# Patient Record
Sex: Male | Born: 1970 | Race: Black or African American | Hispanic: No | Smoking: Never smoker
Health system: Southern US, Community
[De-identification: ages and names within clinical notes are randomized; demographics above are authoritative.]

## PROBLEM LIST (undated history)

## (undated) DIAGNOSIS — I10 Essential (primary) hypertension: Secondary | ICD-10-CM

## (undated) DIAGNOSIS — G4700 Insomnia, unspecified: Secondary | ICD-10-CM

## (undated) DIAGNOSIS — Z9989 Dependence on other enabling machines and devices: Secondary | ICD-10-CM

## (undated) DIAGNOSIS — E785 Hyperlipidemia, unspecified: Secondary | ICD-10-CM

## (undated) DIAGNOSIS — G4733 Obstructive sleep apnea (adult) (pediatric): Secondary | ICD-10-CM

## (undated) HISTORY — DX: Hyperlipidemia, unspecified: E78.5

## (undated) HISTORY — DX: Insomnia, unspecified: G47.00

## (undated) HISTORY — DX: Obstructive sleep apnea (adult) (pediatric): G47.33

## (undated) HISTORY — DX: Essential (primary) hypertension: I10

## (undated) HISTORY — DX: Dependence on other enabling machines and devices: Z99.89

## (undated) HISTORY — PX: WISDOM TOOTH EXTRACTION: SHX21

---

## 2005-04-07 ENCOUNTER — Ambulatory Visit: Payer: Self-pay | Admitting: Family Medicine

## 2011-11-10 ENCOUNTER — Other Ambulatory Visit: Payer: Self-pay | Admitting: Family Medicine

## 2011-11-10 DIAGNOSIS — R109 Unspecified abdominal pain: Secondary | ICD-10-CM

## 2011-11-14 ENCOUNTER — Ambulatory Visit
Admission: RE | Admit: 2011-11-14 | Discharge: 2011-11-14 | Disposition: A | Discharge status: Home / Self Care | Payer: Self-pay | Source: Ambulatory Visit | Attending: Family Medicine | Admitting: Family Medicine

## 2011-11-14 DIAGNOSIS — R109 Unspecified abdominal pain: Secondary | ICD-10-CM

## 2012-05-01 ENCOUNTER — Encounter (HOSPITAL_BASED_OUTPATIENT_CLINIC_OR_DEPARTMENT_OTHER): Payer: Self-pay | Admitting: *Deleted

## 2012-05-01 ENCOUNTER — Emergency Department (HOSPITAL_BASED_OUTPATIENT_CLINIC_OR_DEPARTMENT_OTHER)
Admission: EM | Admit: 2012-05-01 | Discharge: 2012-05-01 | Disposition: A | Discharge status: Home / Self Care | Payer: Worker's Compensation | Attending: Emergency Medicine | Admitting: Emergency Medicine

## 2012-05-01 DIAGNOSIS — IMO0002 Reserved for concepts with insufficient information to code with codable children: Secondary | ICD-10-CM | POA: Insufficient documentation

## 2012-05-01 DIAGNOSIS — S56919A Strain of unspecified muscles, fascia and tendons at forearm level, unspecified arm, initial encounter: Secondary | ICD-10-CM

## 2012-05-01 DIAGNOSIS — Y929 Unspecified place or not applicable: Secondary | ICD-10-CM | POA: Insufficient documentation

## 2012-05-01 DIAGNOSIS — Y9389 Activity, other specified: Secondary | ICD-10-CM | POA: Insufficient documentation

## 2012-05-01 DIAGNOSIS — X58XXXA Exposure to other specified factors, initial encounter: Secondary | ICD-10-CM | POA: Insufficient documentation

## 2012-05-01 NOTE — ED Provider Notes (Signed)
History     CSN: [id number]  Arrival date & time 05/01/12  2151   First MD Initiated Contact with Patient 05/01/12 2212      Chief Complaint  Patient presents with  . Arm Pain    (Consider location/radiation/quality/duration/timing/severity/associated sxs/prior treatment) HPI Pt reports onset of mild-moderate aching pain in R forearm after opening a door earlier today, pain has been persistent through the day worse with movement of wrist or lifting. No falls or direct trauma.   No past medical history on file.  History reviewed. No pertinent past surgical history.  No family history on file.  History  Substance Use Topics  . Smoking status: Never Smoker   . Smokeless tobacco: Not on file  . Alcohol Use: No      Review of Systems All other systems reviewed and are negative except as noted in HPI.   Allergies  Review of patient's allergies indicates no known allergies.  Home Medications  No current outpatient prescriptions on file.  BP 160/97  Pulse 87  Temp 98.5 F (36.9 C) (Oral)  Resp 20  SpO2 99%  Physical Exam  Nursing note and vitals reviewed. Constitutional: He is oriented to person, place, and time. He appears well-developed and well-nourished.  HENT:  Head: Normocephalic and atraumatic.  Eyes: EOM are normal. Pupils are equal, round, and reactive to light.  Neck: Normal range of motion. Neck supple.  Cardiovascular: Normal rate, normal heart sounds and intact distal pulses.   Pulmonary/Chest: Effort normal and breath sounds normal.  Abdominal: Bowel sounds are normal. He exhibits no distension. There is no tenderness.  Musculoskeletal: Normal range of motion. He exhibits tenderness (mild tenderness to R forearm). He exhibits no edema.       No swelling, no deformity, normal muscle/tendon function  Neurological: He is alert and oriented to person, place, and time. He has normal strength. No cranial nerve deficit or sensory deficit.  Skin: Skin is  warm and dry. No rash noted.  Psychiatric: He has a normal mood and affect.    ED Course  Procedures (including critical care time)  Labs Reviewed - No data to display No results found.   No diagnosis found.    MDM  Likely forearm muscle strain, no concern for bony injury. Advised RICE therapy. Pt is off work for the next 4 days.         Charles B. Bernette Mayers, MD 05/01/12 2225

## 2012-05-01 NOTE — ED Notes (Signed)
Was opening a door and had a sudden onset of sharp pain in his right forearm.

## 2013-07-22 ENCOUNTER — Ambulatory Visit: Payer: 59 | Admitting: Cardiology

## 2013-12-17 ENCOUNTER — Ambulatory Visit
Admission: RE | Admit: 2013-12-17 | Discharge: 2013-12-17 | Disposition: A | Discharge status: Home / Self Care | Payer: 59 | Source: Ambulatory Visit | Attending: Family Medicine | Admitting: Family Medicine

## 2013-12-17 ENCOUNTER — Other Ambulatory Visit: Payer: Self-pay | Admitting: Family Medicine

## 2013-12-17 DIAGNOSIS — M25562 Pain in left knee: Secondary | ICD-10-CM

## 2013-12-26 ENCOUNTER — Ambulatory Visit
Admission: RE | Admit: 2013-12-26 | Discharge: 2013-12-26 | Disposition: A | Discharge status: Home / Self Care | Payer: 59 | Source: Ambulatory Visit | Attending: Family Medicine | Admitting: Family Medicine

## 2013-12-26 ENCOUNTER — Other Ambulatory Visit: Payer: Self-pay | Admitting: Family Medicine

## 2013-12-26 DIAGNOSIS — M549 Dorsalgia, unspecified: Secondary | ICD-10-CM

## 2015-06-24 ENCOUNTER — Other Ambulatory Visit: Payer: Self-pay | Admitting: Family Medicine

## 2015-06-24 ENCOUNTER — Ambulatory Visit
Admission: RE | Admit: 2015-06-24 | Discharge: 2015-06-24 | Disposition: A | Discharge status: Home / Self Care | Payer: Commercial Managed Care - HMO | Source: Ambulatory Visit | Attending: Family Medicine | Admitting: Family Medicine

## 2015-06-24 DIAGNOSIS — J690 Pneumonitis due to inhalation of food and vomit: Secondary | ICD-10-CM

## 2016-08-17 DIAGNOSIS — J019 Acute sinusitis, unspecified: Secondary | ICD-10-CM | POA: Diagnosis not present

## 2016-08-17 DIAGNOSIS — I10 Essential (primary) hypertension: Secondary | ICD-10-CM | POA: Diagnosis not present

## 2016-09-20 DIAGNOSIS — I10 Essential (primary) hypertension: Secondary | ICD-10-CM | POA: Diagnosis not present

## 2016-09-20 DIAGNOSIS — J309 Allergic rhinitis, unspecified: Secondary | ICD-10-CM | POA: Diagnosis not present

## 2016-10-24 DIAGNOSIS — K219 Gastro-esophageal reflux disease without esophagitis: Secondary | ICD-10-CM | POA: Diagnosis not present

## 2017-03-09 DIAGNOSIS — E559 Vitamin D deficiency, unspecified: Secondary | ICD-10-CM | POA: Diagnosis not present

## 2017-03-09 DIAGNOSIS — Z23 Encounter for immunization: Secondary | ICD-10-CM | POA: Diagnosis not present

## 2017-03-09 DIAGNOSIS — R5383 Other fatigue: Secondary | ICD-10-CM | POA: Diagnosis not present

## 2017-03-09 DIAGNOSIS — Z0000 Encounter for general adult medical examination without abnormal findings: Secondary | ICD-10-CM | POA: Diagnosis not present

## 2017-03-09 DIAGNOSIS — Z79899 Other long term (current) drug therapy: Secondary | ICD-10-CM | POA: Diagnosis not present

## 2017-03-09 DIAGNOSIS — I10 Essential (primary) hypertension: Secondary | ICD-10-CM | POA: Diagnosis not present

## 2017-03-09 DIAGNOSIS — E291 Testicular hypofunction: Secondary | ICD-10-CM | POA: Diagnosis not present

## 2017-03-15 ENCOUNTER — Other Ambulatory Visit: Payer: Self-pay | Admitting: Family Medicine

## 2017-03-15 DIAGNOSIS — R5383 Other fatigue: Secondary | ICD-10-CM

## 2017-03-20 ENCOUNTER — Ambulatory Visit (HOSPITAL_COMMUNITY): Payer: 59 | Attending: Cardiology

## 2017-03-20 ENCOUNTER — Other Ambulatory Visit: Payer: Self-pay

## 2017-03-20 DIAGNOSIS — R5383 Other fatigue: Secondary | ICD-10-CM | POA: Diagnosis not present

## 2017-03-31 ENCOUNTER — Encounter: Payer: Self-pay | Admitting: Cardiology

## 2017-03-31 ENCOUNTER — Ambulatory Visit (INDEPENDENT_AMBULATORY_CARE_PROVIDER_SITE_OTHER): Payer: 59 | Admitting: Cardiology

## 2017-03-31 VITALS — BP 142/82 | HR 60 | Ht 71.0 in | Wt 260.0 lb

## 2017-03-31 DIAGNOSIS — R0609 Other forms of dyspnea: Secondary | ICD-10-CM | POA: Insufficient documentation

## 2017-03-31 DIAGNOSIS — R5383 Other fatigue: Secondary | ICD-10-CM | POA: Diagnosis not present

## 2017-03-31 DIAGNOSIS — I10 Essential (primary) hypertension: Secondary | ICD-10-CM | POA: Diagnosis not present

## 2017-03-31 DIAGNOSIS — R0600 Dyspnea, unspecified: Secondary | ICD-10-CM | POA: Insufficient documentation

## 2017-03-31 DIAGNOSIS — R9431 Abnormal electrocardiogram [ECG] [EKG]: Secondary | ICD-10-CM | POA: Insufficient documentation

## 2017-03-31 NOTE — Assessment & Plan Note (Signed)
Borderline control on Tribenzor.  No signs of hypertensive heart disease on echo With his fatigue, I would be reluctant to use a beta-blocker.   Could potentially titrate up the amlodipine portion of Tribenzor.

## 2017-03-31 NOTE — Patient Instructions (Addendum)
NO CHANGE WITH CURRENT MEDICATION  SCHEDULE AT [facility] Your physician has recommended that you have a cardiopulmonary stress test (CPX). CPX testing is a non-invasive measurement of heart and lung function. It replaces a traditional treadmill stress test. This type of test provides a tremendous amount of information that relates not only to your present condition but also for future outcomes. This test combines measurements of you ventilation, respiratory gas exchange in the lungs, electrocardiogram (EKG), blood pressure and physical response before, during, and following an exercise protocol.    Your physician recommends that you schedule a follow-up appointment in 1 MONTH WITH DR HARDING.  Cardiopulmonary Exercise Stress Test Cardiopulmonary exercise testing (CPET) is a test that checks how your heart and lungs react to exercise. This is called your exercise capacity. During this test, you will walk or run on a treadmill or pedal on a stationary bike while tests are done on your heart and lungs. You may have this test to:  See why you are short of breath.  Check for exercise intolerance.  See how your lungs work.  See how your heart works.  Check for how you are responding to a heart or lung rehabilitation program.  See if you have a heart or lung problem.  See if you are healthy enough to have surgery.  What happens before the procedure?  Follow instructions from your doctor about what you cannot eat or drink.  Ask your doctor about changing or stopping your normal medicines. This is important if you take diabetes medicines or blood thinners.  Wear loose, comfortable clothing and shoes.  If you use an inhaler, bring it with you to the test. What happens during the procedure?  A blood pressure cuff will be placed on your arm.  Several stick-on patches (electrodes) will be placed on your chest. They will be attached to an electrocardiogram (EKG) machine.  A clip-on  monitor that measures the amount of oxygen in your blood will be placed on your finger (pulse oximeter).  A clip will be placed on your nose and a mouthpiece will be placed in your mouth. This may be held in place with a headpiece. You will breathe through the mouthpiece during the test.  You will be asked to start exercising. You will be closely watched while you exercise.  The amount of effort for your exercise will be gradually increased.  During exercise, the test will measure: ? Your heart rate. ? Your heart rhythm. ? Your oxygen blood level. ? The amount of oxygen and carbon dioxide that you breathe out.  The test will end when: ? You have finished the test. ? You have reached your maximum ability to exercise. ? You have chest or leg pain, dizziness, or shortness of breath. The procedure may vary among doctors and hospitals. What happens after the procedure?  Your blood pressure and EKG will be checked to watch how you recover from the test. This information is not intended to replace advice given to you by your health care provider. Make sure you discuss any questions you have with your health care provider. Document Released: 04/27/2009 Document Revised: 09/29/2015 Document Reviewed: 03/23/2015 Elsevier Interactive Patient Education  2018 [employer]

## 2017-03-31 NOTE — Assessment & Plan Note (Signed)
Hard to tell what this is related to.  Could simply be related to poor sleep or allergies.  He is being treated for sleep apnea, and one would expect it to be effective.  To evaluate for a potential cardiac etiology we will do a CPX

## 2017-03-31 NOTE — Assessment & Plan Note (Signed)
EKG shows sinus rhythm really prominent T wave inversions in inferior and lateral look more consistent with LVH repolarization abnormalities.  Cannot exclude ischemia, therefore we will evaluate with CPX for ischemia.

## 2017-03-31 NOTE — Assessment & Plan Note (Signed)
Difficult to tell if this is cardiac or simply due to deconditioning potentially related to his CPAP.  As he is not truly having anginal type symptoms, I would prefer to evaluate physiologically. Echocardiogram looked normal with normal ejection fraction and diastolic function.  Plan: CPX (cardiopulmonary exercise test)

## 2017-03-31 NOTE — Progress Notes (Signed)
PCP: Mila Palmer, MD  Clinic Note: Chief Complaint  Patient presents with  . New Patient (Initial Visit)    Fatigue, shortness of breath    HPI:  Leroy Cochran is a 46 y.o. male who is being seen today for the evaluation of Fatigue & DOE at the request of Mila Palmer, MD.  Leroy Cochran was last seen on March 09, 2017 by Dr. Paulino Rily --> he was there for routine follow-up, was noticing some fatigue and some dyspnea.  A 2D echocardiogram was ordered.  Recent Hospitalizations: None  Studies Personally Reviewed - (if available, images/films reviewed: From Epic Chart or Care Everywhere)  2DEcho 03/20/2017: Normal LV systolic and diastolic function; trace MR and TR.  EF 60-65%  Interval History: Leroy Cochran presents for essentially a follow-up after his echocardiogram indicating that he just has had pretty significant sensation of feeling just tired all the time.  He just does not have a lot of energy.  Not much get up and go.  He notes that anytime he does any exercise he is just feels tired and short of breath.  He has had intermittent episodes of slight twinging chest discomfort here and there but nothing sustained or necessarily associated with exertion. He has severe OSA and does use CPAP.  If he tries to sleep without CPAP, he cannot breathe.  He has been using CPAP for about 3 or 4 years.  He also has been told that he had low vitamin D level as well as low testosterone level.  Remainder of cardiac review of symptoms: No PND, orthopnea or edema.  No palpitations, lightheadedness, dizziness, weakness or syncope/near syncope. No TIA/amaurosis fugax symptoms. No claudication.  ROS: A comprehensive was performed.  Pertinent symptoms noted in HPI Review of Systems  Constitutional: Positive for malaise/fatigue.  HENT: Positive for congestion (Constantly feels congested). Negative for nosebleeds.   Respiratory: Positive for cough (Mostly in the morning) and shortness of breath  (If he does not use CPAP). Negative for sputum production.   Gastrointestinal: Negative for blood in stool and melena.  Genitourinary: Negative for hematuria.  Musculoskeletal: Negative for joint pain.  Neurological: Negative for dizziness.  Endo/Heme/Allergies: Positive for environmental allergies.  Psychiatric/Behavioral: Negative for depression and memory loss. The patient has insomnia.   All other systems reviewed and are negative.  I have reviewed and (if needed) personally updated the patient's problem list, medications, allergies, past medical and surgical history, social and family history.   Past Medical History:  Diagnosis Date  . Essential hypertension   . Hyperlipidemia   . Insomnia   . OSA on CPAP    Dr.Turner;; CPAP -1, (RDI=82.7, Low O2 - 78%)    Past Surgical History:  Procedure Laterality Date  . WISDOM TOOTH EXTRACTION      No outpatient medications have been marked as taking for the 03/31/17 encounter (Office Visit) with Marykay Lex, MD.    No Known Allergies  Social History   Socioeconomic History  . Marital status: Married    Spouse name: None  . Number of children: 1  . Years of education: None  . Highest education level: High school graduate  Social Needs  . Financial resource strain: None  . Food insecurity - worry: None  . Food insecurity - inability: None  . Transportation needs - medical: None  . Transportation needs - non-medical: None  Occupational History  . None  Tobacco Use  . Smoking status: Never Smoker  . Smokeless tobacco: Never  Used  Substance and Sexual Activity  . Alcohol use: Yes    Comment: occasional wine  . Drug use: No  . Sexual activity: None  Other Topics Concern  . None  Social History Narrative   He is a divorced father of 1 daughter.  He lives with his daughter.    family history includes Cervical cancer in his mother; Healthy in his daughter, father, and sister; Heart attack in his maternal  grandfather.  Wt Readings from Last 3 Encounters:  03/31/17 260 lb (117.9 kg)    PHYSICAL EXAM BP (!) 142/82   Pulse 60   Ht 5\' 11"  (1.803 m)   Wt 260 lb (117.9 kg)   SpO2 99%   BMI 36.26 kg/m  Physical Exam  Constitutional: He is oriented to person, place, and time. He appears well-developed and well-nourished. No distress.  Healthy-appearing  HENT:  Head: Normocephalic and atraumatic.  Mouth/Throat: No oropharyngeal exudate.  Eyes: Conjunctivae and EOM are normal. Pupils are equal, round, and reactive to light. No scleral icterus.  Neck: Normal range of motion. Neck supple. No hepatojugular reflux and no JVD present. Carotid bruit is not present. No thyromegaly present.  Cardiovascular: Normal rate, regular rhythm, normal heart sounds, intact distal pulses and normal pulses.  No extrasystoles are present. PMI is not displaced. Exam reveals no gallop, no friction rub and no midsystolic click.  No murmur heard. Pulmonary/Chest: Effort normal and breath sounds normal. No respiratory distress. He has no wheezes. He has no rales.  Abdominal: Soft. Bowel sounds are normal. He exhibits no distension. There is no tenderness. There is no rebound and no guarding.  Musculoskeletal: Normal range of motion. He exhibits no edema or deformity.  Neurological: He is alert and oriented to person, place, and time. No cranial nerve deficit.  Skin: Skin is warm and dry. No rash noted. No erythema. No pallor.  Psychiatric: He has a normal mood and affect. Judgment and thought content normal.  Nursing note and vitals reviewed.    Adult ECG Report  Rate: 60;  Rhythm: normal sinus rhythm and Cannot rule out inferior MI, age undetermined.  Criteria consistent with LVH.  T wave inversions in lateral leads, cannot exclude ischemia.  (Likely LVH repolarization);   Narrative Interpretation: Borderline EKG   Other studies Reviewed: Additional studies/ records that were reviewed today include:  Recent  Labs: Labs not currently available  ASSESSMENT / PLAN: Problem List Items Addressed This Visit    Abnormal electrocardiogram (ECG) (EKG)    EKG shows sinus rhythm really prominent T wave inversions in inferior and lateral look more consistent with LVH repolarization abnormalities.  Cannot exclude ischemia, therefore we will evaluate with CPX for ischemia.      Relevant Orders   EKG 12-Lead   Cardiopulmonary exercise test   Dyspnea on exertion - Primary    Difficult to tell if this is cardiac or simply due to deconditioning potentially related to his CPAP.  As he is not truly having anginal type symptoms, I would prefer to evaluate physiologically. Echocardiogram looked normal with normal ejection fraction and diastolic function.  Plan: CPX (cardiopulmonary exercise test)      Relevant Orders   EKG 12-Lead   Cardiopulmonary exercise test   Essential hypertension (Chronic)    Borderline control on Tribenzor.  No signs of hypertensive heart disease on echo With his fatigue, I would be reluctant to use a beta-blocker.   Could potentially titrate up the amlodipine portion of Tribenzor.  Relevant Medications   TRIBENZOR 40-5-12.5 MG TABS   Fatigue    Hard to tell what this is related to.  Could simply be related to poor sleep or allergies.  He is being treated for sleep apnea, and one would expect it to be effective.  To evaluate for a potential cardiac etiology we will do a CPX      Relevant Orders   EKG 12-Lead   Cardiopulmonary exercise test      Current medicines are reviewed at length with the patient today. (+/- concerns) none The following changes have been made:None  Patient Instructions  NO CHANGE WITH CURRENT MEDICATION  [facility]ULE AT [facility] Your physician has recommended that you have a cardiopulmonary stress test (CPX). CPX testing is a non-invasive measurement of heart and lung function. It replaces a traditional treadmill stress test. This type of  test provides a tremendous amount of information that relates not only to your present condition but also for future outcomes. This test combines measurements of you ventilation, respiratory gas exchange in the lungs, electrocardiogram (EKG), blood pressure and physical response before, during, and following an exercise protocol.    Your physician recommends that you schedule a follow-up appointment in 1 MONTH WITH DR HARDING.  Cardiopulmonary Exercise Stress Test Cardiopulmonary exercise testing (CPET) is a test that checks how your heart and lungs react to exercise. This is called your exercise capacity. During this test, you will walk or run on a treadmill or pedal on a stationary bike while tests are done on your heart and lungs. You may have this test to:  See why you are short of breath.  Check for exercise intolerance.  See how your lungs work.  See how your heart works.  Check for how you are responding to a heart or lung rehabilitation program.  See if you have a heart or lung problem.  See if you are healthy enough to have surgery.  What happens before the procedure?  Follow instructions from your doctor about what you cannot eat or drink.  Ask your doctor about changing or stopping your normal medicines. This is important if you take diabetes medicines or blood thinners.  Wear loose, comfortable clothing and shoes.  If you use an inhaler, bring it with you to the test. What happens during the procedure?  A blood pressure cuff will be placed on your arm.  Several stick-on patches (electrodes) will be placed on your chest. They will be attached to an electrocardiogram (EKG) machine.  A clip-on monitor that measures the amount of oxygen in your blood will be placed on your finger (pulse oximeter).  A clip will be placed on your nose and a mouthpiece will be placed in your mouth. This may be held in place with a headpiece. You will breathe through the mouthpiece during  the test.  You will be asked to start exercising. You will be closely watched while you exercise.  The amount of effort for your exercise will be gradually increased.  During exercise, the test will measure: ? Your heart rate. ? Your heart rhythm. ? Your oxygen blood level. ? The amount of oxygen and carbon dioxide that you breathe out.  The test will end when: ? You have finished the test. ? You have reached your maximum ability to exercise. ? You have chest or leg pain, dizziness, or shortness of breath. The procedure may vary among doctors and hospitals. What happens after the procedure?  Your blood pressure and EKG  will be checked to watch how you recover from the test. This information is not intended to replace advice given to you by your health care provider. Make sure you discuss any questions you have with your health care provider. Document Released: 04/27/2009 Document Revised: 09/29/2015 Document Reviewed: 03/23/2015 Elsevier Interactive Patient Education  2018 [employer].    Studies Ordered:   Orders Placed This Encounter  Procedures  . Cardiopulmonary exercise test  . EKG 12-Lead      Bryan Lemma, M.D., M.S. Interventional Cardiologist   Pager # [phone] Phone # [phone] [address] [place], [place] [address]

## 2017-04-02 ENCOUNTER — Encounter: Payer: Self-pay | Admitting: Cardiology

## 2017-04-03 DIAGNOSIS — R0981 Nasal congestion: Secondary | ICD-10-CM | POA: Diagnosis not present

## 2017-04-03 DIAGNOSIS — J342 Deviated nasal septum: Secondary | ICD-10-CM | POA: Diagnosis not present

## 2017-04-03 DIAGNOSIS — J343 Hypertrophy of nasal turbinates: Secondary | ICD-10-CM | POA: Diagnosis not present

## 2017-04-17 ENCOUNTER — Other Ambulatory Visit (HOSPITAL_COMMUNITY): Payer: Self-pay | Admitting: *Deleted

## 2017-04-17 ENCOUNTER — Ambulatory Visit (HOSPITAL_COMMUNITY): Payer: 59 | Attending: Cardiology

## 2017-04-17 DIAGNOSIS — R5383 Other fatigue: Secondary | ICD-10-CM

## 2017-04-17 DIAGNOSIS — R0600 Dyspnea, unspecified: Secondary | ICD-10-CM | POA: Diagnosis not present

## 2017-04-17 DIAGNOSIS — R9431 Abnormal electrocardiogram [ECG] [EKG]: Secondary | ICD-10-CM | POA: Diagnosis present

## 2017-04-17 DIAGNOSIS — R0609 Other forms of dyspnea: Secondary | ICD-10-CM | POA: Diagnosis not present

## 2017-04-17 HISTORY — PX: OTHER SURGICAL HISTORY: SHX169

## 2017-04-20 DIAGNOSIS — E291 Testicular hypofunction: Secondary | ICD-10-CM | POA: Diagnosis not present

## 2017-04-20 DIAGNOSIS — Z79899 Other long term (current) drug therapy: Secondary | ICD-10-CM | POA: Diagnosis not present

## 2017-05-04 ENCOUNTER — Encounter: Payer: Self-pay | Admitting: Cardiology

## 2017-05-04 ENCOUNTER — Ambulatory Visit: Payer: 59 | Admitting: Cardiology

## 2017-05-04 VITALS — BP 136/92 | HR 80 | Ht 71.0 in | Wt 261.6 lb

## 2017-05-04 DIAGNOSIS — R9431 Abnormal electrocardiogram [ECG] [EKG]: Secondary | ICD-10-CM | POA: Diagnosis not present

## 2017-05-04 DIAGNOSIS — R0600 Dyspnea, unspecified: Secondary | ICD-10-CM

## 2017-05-04 DIAGNOSIS — R0609 Other forms of dyspnea: Secondary | ICD-10-CM | POA: Diagnosis not present

## 2017-05-04 DIAGNOSIS — R5383 Other fatigue: Secondary | ICD-10-CM

## 2017-05-04 DIAGNOSIS — I10 Essential (primary) hypertension: Secondary | ICD-10-CM

## 2017-05-04 NOTE — Assessment & Plan Note (Signed)
Based on CPX results, I would suggest that his symptoms are probably related to deconditioning and obesity.  Recommend increasing exercise level and weight loss.

## 2017-05-04 NOTE — Assessment & Plan Note (Signed)
Borderline elevated diastolic pressure today, but for his stress test, his diastolic pressure was relatively normal and appropriately went down despite having a systolic hypertension response. -Closely monitor, and consider possibility of additional medication which would probably be best suited to be a beta-blocker such as carvedilol or Bystolic.

## 2017-05-04 NOTE — Assessment & Plan Note (Signed)
More consistent with repolarization changes.  Nonischemic CPX.  Relatively normal echocardiogram.

## 2017-05-04 NOTE — Assessment & Plan Note (Signed)
Normal echocardiogram and essentially normal CPX.  He does have some mild hypertension with exercise, but has baseline normal blood pressures.  I would be reluctant to add additional medications at this point to the Tribenzor.  Would potentially consider carvedilol or Bystolic. CPX suggests that most of his symptoms are related to deconditioning and obesity. No real suggestion of coronary ischemia.  EKG had some subtle changes, but not diagnostic.

## 2017-05-04 NOTE — Progress Notes (Signed)
PCP: Mila PalmerWolters, Sharon, MD  Clinic Note: Chief Complaint  Patient presents with  . Follow-up    CPX results -evaluate fatigue  . Shortness of Breath    And fatigue with exertion    HPI:  Leroy Cochran is a 46 y.o. male who is being seen today for follow-up evaluation of Fatigue & DOE at the request of Mila PalmerWolters, Sharon, MD.  Leroy KeensMarcus A Cochran was initially seen on March 31, 2017 patient following up an echocardiogram.  He noted fatigue with lack of get up and go.  May be some mild exertional dyspnea. He has severe OSA but does not use CPAP. We ordered a CPX.  Recent Hospitalizations: None  Studies Personally Revi/ewed - (if available, images/films reviewed: From Epic Chart or Care Everywhere)  2DEcho 10/292018: Normal LV systolic and diastolic function; trace MR and TR.  EF 60-65%  CPX  04/17/2017: Limited due to submaximal exercise (did not reach target heart rate).  Response.  Some shortness of breath related to abnormal relaxation.  No cardiopulmonary limitations.  Limitations more related to deconditioning and body habitus.  Interval History: Leroy SpareMarcus presents for essentially a follow-up after his CPX.  He is doing pretty well.  He still notes feeling tired, but denies any significant exertional dyspnea.  Just lack of energy.  He is still using his CPAP. He denies any chest tightness or pressure with rest or exertion.  He will get short of breath if he overexerts, but not with routine activity.  Remainder of cardiac review of symptoms: No PND, orthopnea or edema.  No palpitations, lightheadedness, dizziness, weakness or syncope/near syncope. No TIA/amaurosis fugax symptoms. No claudication.   ROS: A comprehensive was performed.  Pertinent symptoms noted in HPI Review of Systems  Constitutional: Positive for malaise/fatigue. Negative for weight loss.  HENT: Positive for congestion (Constantly feels congested).   Respiratory: Negative for cough, sputum production and  shortness of breath (Only if he does not use CPAP).   Neurological: Negative for dizziness.  Psychiatric/Behavioral: Negative for depression and memory loss. The patient has insomnia.    I have reviewed and (if needed) personally updated the patient's problem list, medications, allergies, past medical and surgical history, social and family history.   Past Medical History:  Diagnosis Date  . Essential hypertension   . Hyperlipidemia   . Insomnia   . OSA on CPAP    Dr.Turner;; CPAP -1, (RDI=82.7, Low O2 - 78%)    Past Surgical History:  Procedure Laterality Date  . CPX - CARDIOPULMONARY EXERCISE TEST  04/17/2017    Limited due to submaximal exercise (did not reach target heart rate).  Response.  Some shortness of breath related to abnormal relaxation.  No cardiopulmonary limitations.  Limitations more related to deconditioning and body habitus.  . WISDOM TOOTH EXTRACTION      Current Meds  Medication Sig  . cetirizine (ZYRTEC) 10 MG tablet Take 10 mg by mouth. As needed  . TRIBENZOR 40-5-12.5 MG TABS Take 1 tablet daily by mouth.    No Known Allergies  Social History   Tobacco Use  . Smoking status: Never Smoker  . Smokeless tobacco: Never Used  Substance Use Topics  . Alcohol use: Yes    Comment: occasional wine  . Drug use: No   Social History   Social History Narrative   He is a divorced father of 1 daughter.  He lives with his daughter.     family history includes Cervical cancer in his mother; Healthy in  his daughter, father, and sister; Heart attack in his maternal grandfather.  Wt Readings from Last 3 Encounters:  05/04/17 261 lb 9.6 oz (118.7 kg)  03/31/17 260 lb (117.9 kg)    PHYSICAL EXAM BP (!) 136/92 (BP Location: Left Arm, Patient Position: Sitting, Cuff Size: Large)   Pulse 80   Ht 5\' 11"  (1.803 m)   Wt 261 lb 9.6 oz (118.7 kg)   BMI 36.49 kg/m  Physical Exam  Constitutional: He is oriented to person, place, and time. He appears  well-developed and well-nourished. No distress.  Healthy-appearing  HENT:  Head: Normocephalic and atraumatic.  Eyes: EOM are normal.  Neck: Normal range of motion. Neck supple. No hepatojugular reflux and no JVD present. Carotid bruit is not present.  Cardiovascular: Normal rate, regular rhythm, normal heart sounds, intact distal pulses and normal pulses.  No extrasystoles are present. PMI is not displaced. Exam reveals no gallop, no friction rub and no midsystolic click.  No murmur heard. Pulmonary/Chest: Effort normal and breath sounds normal. No respiratory distress. He has no wheezes. He has no rales.  Musculoskeletal: Normal range of motion. He exhibits no edema.  Neurological: He is alert and oriented to person, place, and time.  Psychiatric: He has a normal mood and affect. His behavior is normal. Judgment and thought content normal.  Nursing note and vitals reviewed.    Adult ECG Report n/a   Other studies Reviewed: Additional studies/ records that were reviewed today include:  Recent Labs: Labs not currently available  ASSESSMENT / PLAN: Problem List Items Addressed This Visit    Abnormal electrocardiogram (ECG) (EKG)    More consistent with repolarization changes.  Nonischemic CPX.  Relatively normal echocardiogram.      Dyspnea on exertion - Primary    Normal echocardiogram and essentially normal CPX.  He does have some mild hypertension with exercise, but has baseline normal blood pressures.  I would be reluctant to add additional medications at this point to the Tribenzor.  Would potentially consider carvedilol or Bystolic. CPX suggests that most of his symptoms are related to deconditioning and obesity. No real suggestion of coronary ischemia.  EKG had some subtle changes, but not diagnostic.      Essential hypertension (Chronic)    Borderline elevated diastolic pressure today, but for his stress test, his diastolic pressure was relatively normal and appropriately  went down despite having a systolic hypertension response. -Closely monitor, and consider possibility of additional medication which would probably be best suited to be a beta-blocker such as carvedilol or Bystolic.      Fatigue    Based on CPX results, I would suggest that his symptoms are probably related to deconditioning and obesity.  Recommend increasing exercise level and weight loss.         Current medicines are reviewed at length with the patient today. (+/- concerns) none The following changes have been made:None  Patient Instructions  Medication Instructions: Your physician recommends that you continue on your current medications as directed. Please refer to the Current Medication list given to you today.   Follow-Up: Your physician recommends that you schedule a follow-up appointment as needed with Dr. Herbie BaltimoreHarding.    Studies Ordered:   No orders of the defined types were placed in this encounter.     Bryan Lemmaavid Shayonna Ocampo, M.D., M.S. Interventional Cardiologist   Pager # [phone] Phone # [phone] [address] [place], [place] [address]

## 2017-05-04 NOTE — Patient Instructions (Signed)
Medication Instructions: Your physician recommends that you continue on your current medications as directed. Please refer to the Current Medication list given to you today.   Follow-Up: Your physician recommends that you schedule a follow-up appointment as needed with Dr. Harding.    

## 2017-05-26 DIAGNOSIS — E291 Testicular hypofunction: Secondary | ICD-10-CM | POA: Diagnosis not present

## 2017-05-26 DIAGNOSIS — E559 Vitamin D deficiency, unspecified: Secondary | ICD-10-CM | POA: Diagnosis not present

## 2017-07-03 DIAGNOSIS — J301 Allergic rhinitis due to pollen: Secondary | ICD-10-CM | POA: Diagnosis not present

## 2017-07-03 DIAGNOSIS — G4733 Obstructive sleep apnea (adult) (pediatric): Secondary | ICD-10-CM | POA: Diagnosis not present

## 2017-08-28 DIAGNOSIS — E291 Testicular hypofunction: Secondary | ICD-10-CM | POA: Diagnosis not present

## 2017-10-02 ENCOUNTER — Other Ambulatory Visit: Payer: Self-pay | Admitting: Nurse Practitioner

## 2017-10-02 ENCOUNTER — Ambulatory Visit
Admission: RE | Admit: 2017-10-02 | Discharge: 2017-10-02 | Disposition: A | Discharge status: Home / Self Care | Payer: Worker's Compensation | Source: Ambulatory Visit | Attending: Nurse Practitioner | Admitting: Nurse Practitioner

## 2017-10-02 DIAGNOSIS — M25571 Pain in right ankle and joints of right foot: Secondary | ICD-10-CM

## 2017-10-02 DIAGNOSIS — M25471 Effusion, right ankle: Secondary | ICD-10-CM

## 2017-11-02 DIAGNOSIS — Z0100 Encounter for examination of eyes and vision without abnormal findings: Secondary | ICD-10-CM | POA: Diagnosis not present

## 2017-11-28 DIAGNOSIS — E291 Testicular hypofunction: Secondary | ICD-10-CM | POA: Diagnosis not present

## 2017-12-19 DIAGNOSIS — R0789 Other chest pain: Secondary | ICD-10-CM | POA: Diagnosis not present

## 2017-12-19 DIAGNOSIS — I10 Essential (primary) hypertension: Secondary | ICD-10-CM | POA: Diagnosis not present

## 2018-01-01 DIAGNOSIS — G4733 Obstructive sleep apnea (adult) (pediatric): Secondary | ICD-10-CM | POA: Diagnosis not present

## 2018-01-31 DIAGNOSIS — G4733 Obstructive sleep apnea (adult) (pediatric): Secondary | ICD-10-CM | POA: Diagnosis not present

## 2018-02-01 DIAGNOSIS — G4733 Obstructive sleep apnea (adult) (pediatric): Secondary | ICD-10-CM | POA: Diagnosis not present

## 2018-02-05 DIAGNOSIS — I10 Essential (primary) hypertension: Secondary | ICD-10-CM | POA: Diagnosis not present

## 2018-02-05 DIAGNOSIS — E291 Testicular hypofunction: Secondary | ICD-10-CM | POA: Diagnosis not present

## 2018-02-05 DIAGNOSIS — G4733 Obstructive sleep apnea (adult) (pediatric): Secondary | ICD-10-CM | POA: Diagnosis not present

## 2018-03-03 DIAGNOSIS — G4733 Obstructive sleep apnea (adult) (pediatric): Secondary | ICD-10-CM | POA: Diagnosis not present

## 2018-03-09 DIAGNOSIS — Z23 Encounter for immunization: Secondary | ICD-10-CM | POA: Diagnosis not present

## 2018-04-03 DIAGNOSIS — G4733 Obstructive sleep apnea (adult) (pediatric): Secondary | ICD-10-CM | POA: Diagnosis not present

## 2018-04-26 DIAGNOSIS — Z13220 Encounter for screening for lipoid disorders: Secondary | ICD-10-CM | POA: Diagnosis not present

## 2018-05-03 DIAGNOSIS — G4733 Obstructive sleep apnea (adult) (pediatric): Secondary | ICD-10-CM | POA: Diagnosis not present

## 2018-06-03 DIAGNOSIS — G4733 Obstructive sleep apnea (adult) (pediatric): Secondary | ICD-10-CM | POA: Diagnosis not present

## 2018-07-04 DIAGNOSIS — G4733 Obstructive sleep apnea (adult) (pediatric): Secondary | ICD-10-CM | POA: Diagnosis not present

## 2018-07-23 DIAGNOSIS — G4733 Obstructive sleep apnea (adult) (pediatric): Secondary | ICD-10-CM | POA: Diagnosis not present

## 2018-08-02 DIAGNOSIS — G4733 Obstructive sleep apnea (adult) (pediatric): Secondary | ICD-10-CM | POA: Diagnosis not present

## 2018-09-02 DIAGNOSIS — G4733 Obstructive sleep apnea (adult) (pediatric): Secondary | ICD-10-CM | POA: Diagnosis not present

## 2018-10-02 DIAGNOSIS — G4733 Obstructive sleep apnea (adult) (pediatric): Secondary | ICD-10-CM | POA: Diagnosis not present

## 2019-01-17 ENCOUNTER — Other Ambulatory Visit: Payer: Self-pay

## 2019-01-17 DIAGNOSIS — Z20822 Contact with and (suspected) exposure to covid-19: Secondary | ICD-10-CM

## 2019-01-19 LAB — NOVEL CORONAVIRUS, NAA: SARS-CoV-2, NAA: NOT DETECTED

## 2019-11-06 ENCOUNTER — Encounter: Payer: 59 | Attending: Family Medicine | Admitting: Dietician

## 2019-11-06 ENCOUNTER — Other Ambulatory Visit: Payer: Self-pay

## 2019-11-06 DIAGNOSIS — I10 Essential (primary) hypertension: Secondary | ICD-10-CM | POA: Insufficient documentation

## 2019-11-06 NOTE — Patient Instructions (Signed)
Consider keeping a food/ symptom journal.   Utilize the FODMAP checklist as needed to help identify "problem" foods that may need to be eliminated.   Make sure to get in breakfast meal/snack within 2 hours of waking up, and include a source of carbohydrates and protein.

## 2019-11-06 NOTE — Progress Notes (Signed)
Medical Nutrition Therapy   Primary concerns today: low energy, food sensitivities, weight management  Referral diagnosis: E66.9-obesity; I10-hypertension  Preferred learning style: no preference indicated Learning readiness: ready   NUTRITION ASSESSMENT   Anthropometrics  Weight: 256.9 lbs Height: 71 in BMI: 35.8 kg/m2   Lifestyle & Dietary Hx Patient states he has been struggling with very low energy. States that for over the past year he has experienced bloating, gas, and belching. Has identified some foods that he knows cause these symptoms, and at times he is hesitant to eat foods in general because he doesn't know how they will make him feel. Avoids red meat, pork, bread, some dairy (other than cheese), fast food, and most sweets. States he eats a lot of fish, chicken, sushi, and Asian food.   Estimated daily fluid intake: 1 gallon  Stress / self-care: "easy going" and not stress much/often  24-Hr Dietary Recall First Meal: - Snack: Kind bar + fruit  Second Meal: [place] food  Snack: - Third Meal: Hello Fresh meal  Snack: - Beverages: water, soda 1x/week  Estimated Energy Needs Calories: 2400-2600 Carbohydrate: 270-292g Protein: 150-162g Fat: 80-86g   NUTRITION DIAGNOSIS  Nutrition-related knowledge deficit (NB-1.1) related to lack of prior nutrition education as evidenced by questions regarding food and meal ideas for energy and GI symptom management.    NUTRITION INTERVENTION  Nutrition education (E-1) on the following topics:  . General healthful eating  . Balanced meals and snacks for sustained energy throughout the day . Review of FODMAPs to consider due to GI symptoms   Handouts Provided Include   MyPlate Portions & Meal Ideas   Breakfast Ideas  Balanced Snacks   FODMAP Checklist   Learning Style & Readiness for Change Teaching method utilized: Visual & Auditory  Demonstrated degree of understanding via: Teach Back  Barriers to learning/adherence  to lifestyle change: None Identified   Goals Established by Pt . Keep a food/symptom journal  . Pay attention to FODMAPs to help identify "problem" foods  . Eat breakfast (including carbs and protein) within 2 hours of waking up    MONITORING & EVALUATION Dietary intake, weekly physical activity, and goals in 2 months.  Next Steps  Patient is to return to [facility] for follow up visit in about 2 months.

## 2019-11-18 ENCOUNTER — Encounter: Payer: Self-pay | Admitting: Dietician

## 2019-12-16 IMAGING — CR DG ANKLE COMPLETE 3+V*R*
3 series · 3 of 3 positions shown · non-contrast
Comparison: None in PACs

CLINICAL DATA: Inversion-twisting injury of the right ankle on the
morning [DATE]. The patient reports pain and swelling over the
lateral malleolus.

EXAM:
RIGHT ANKLE - COMPLETE 3+ VIEW

[x ankle ap right]
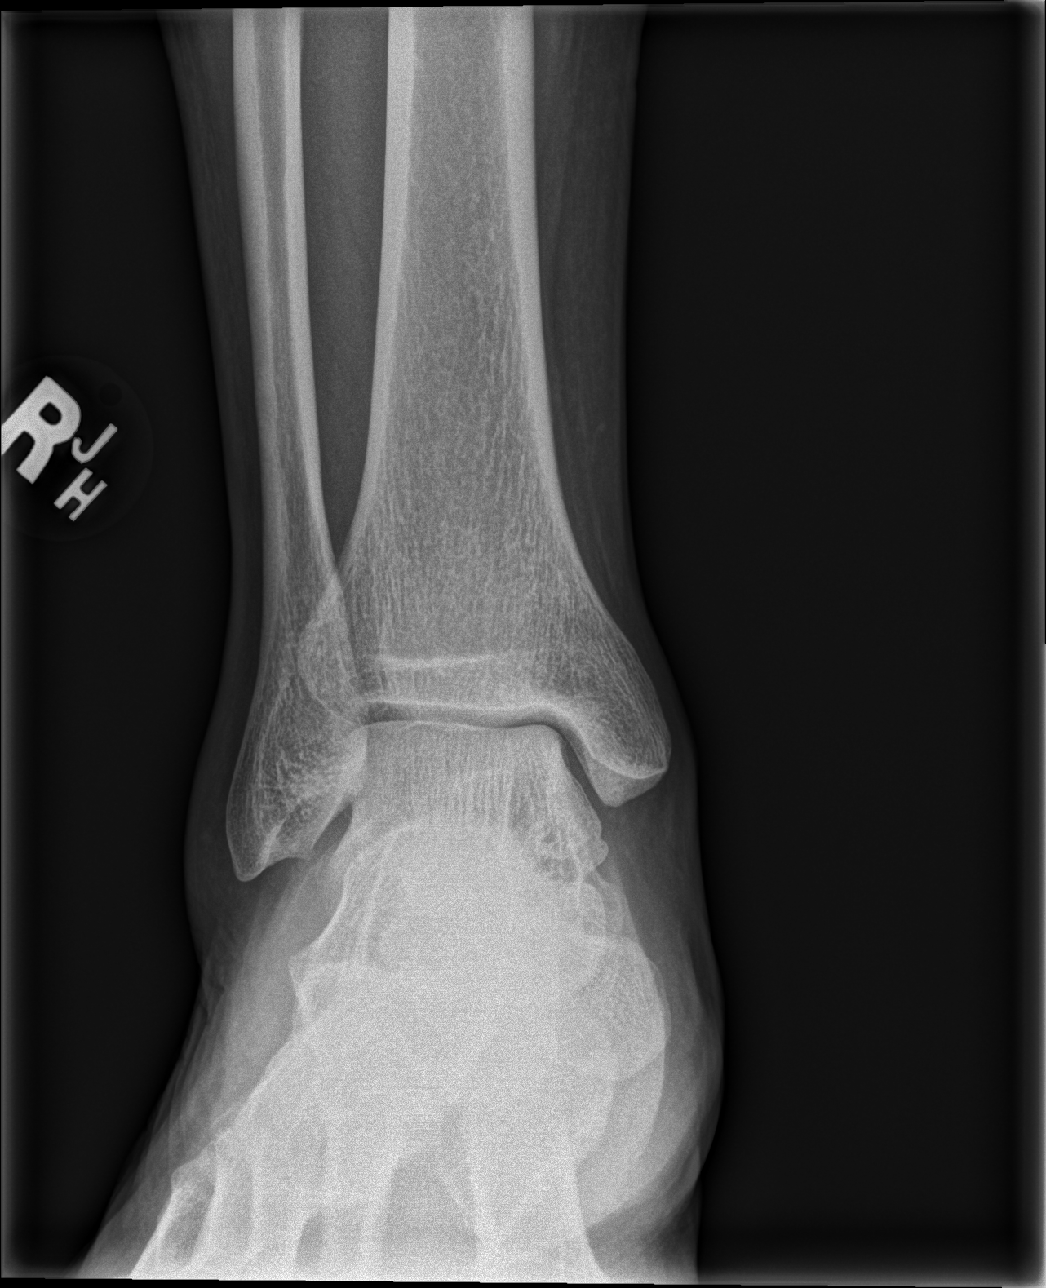

[x ankle obl right]
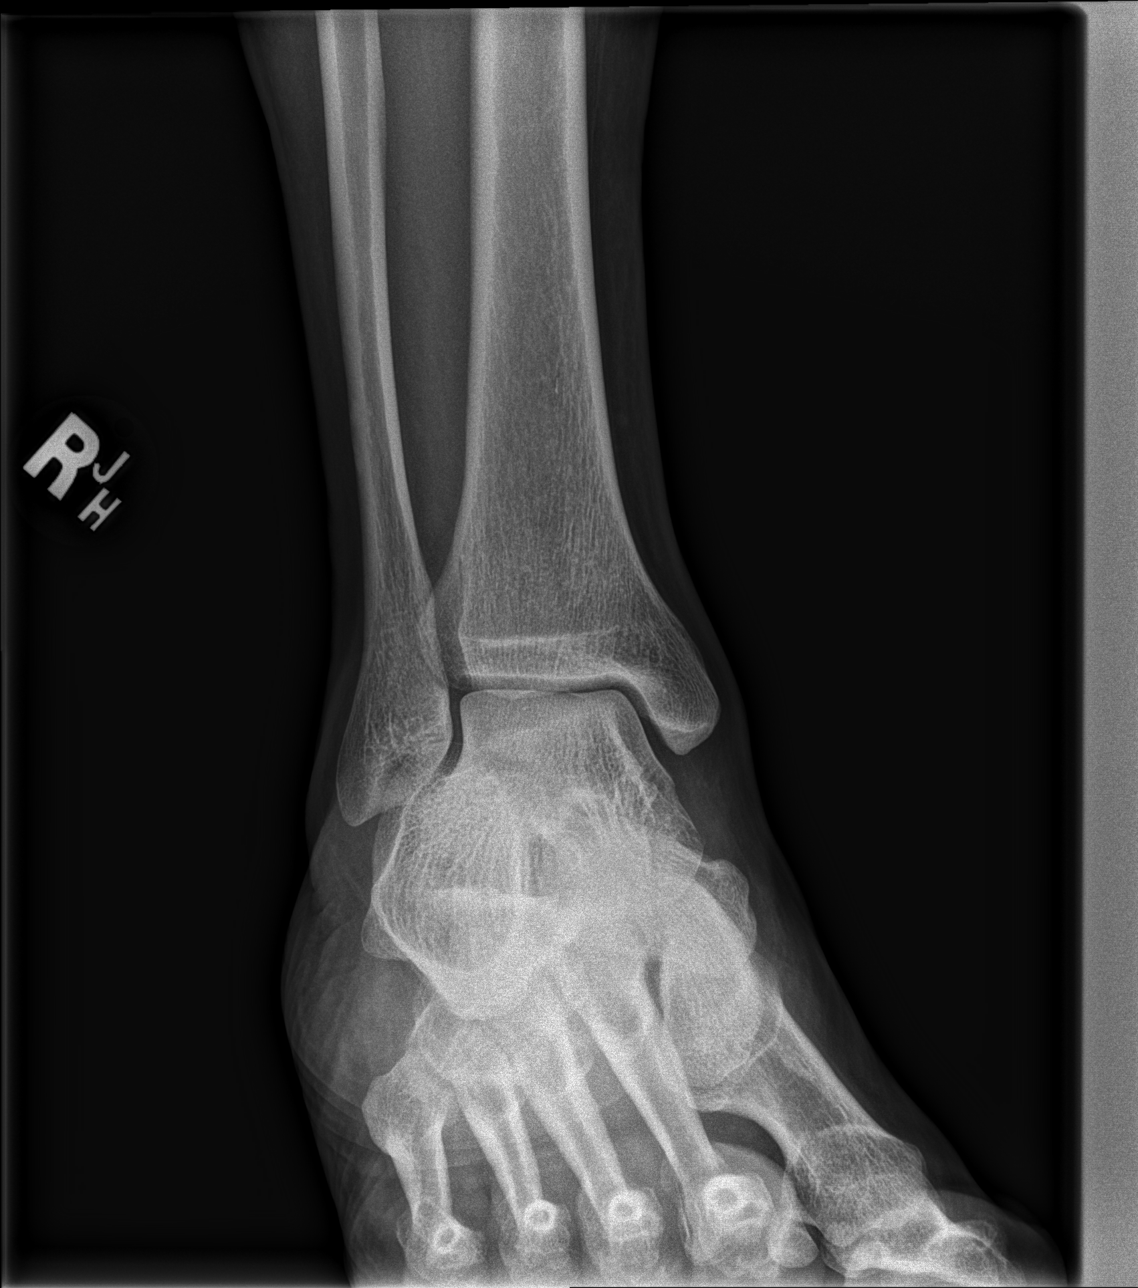

[x ankle lat right]
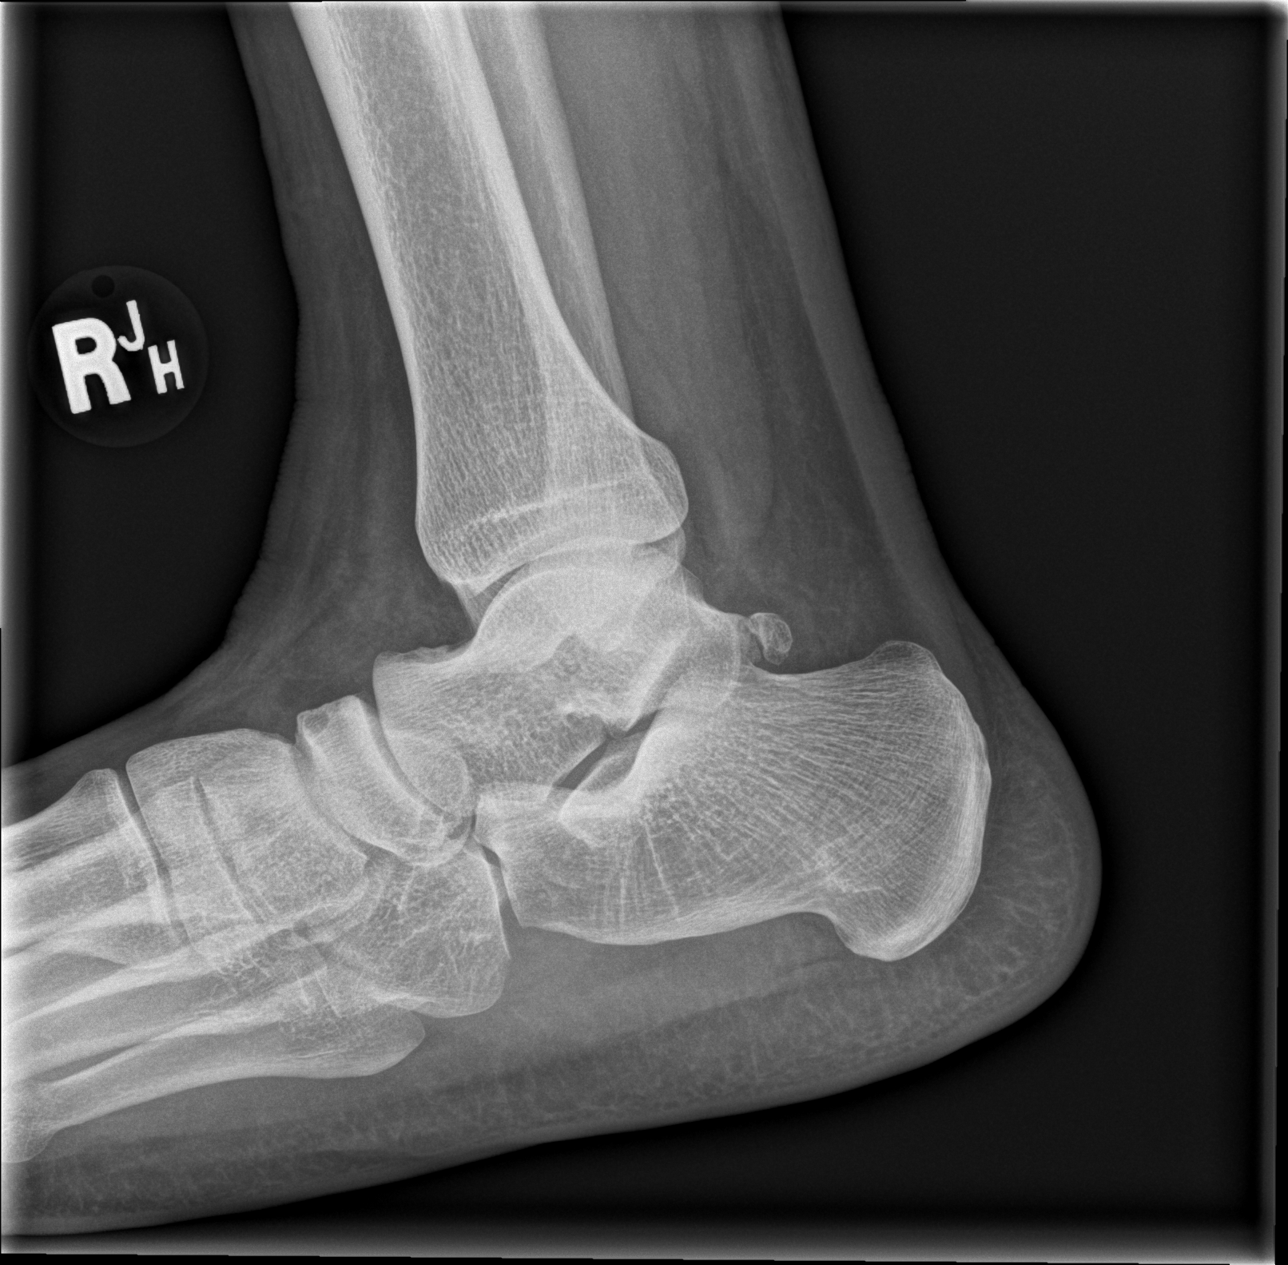

[3 of 3 positions shown; findings below may reference images not displayed]

FINDINGS: The bones are subjectively adequately mineralized. The ankle joint
mortise is preserved. The talar dome and remainder of the talus are
intact as is the calcaneus. There is no acute malleolar fracture.
There is mild soft tissue swelling anteriorly and laterally.
IMPRESSION: No acute bony abnormality of the right ankle. Mild soft tissue
swelling anteriorly and laterally.

## 2020-01-17 ENCOUNTER — Ambulatory Visit: Payer: 59 | Admitting: Dietician

## 2020-03-23 ENCOUNTER — Other Ambulatory Visit: Payer: 59

## 2020-05-07 ENCOUNTER — Observation Stay (HOSPITAL_COMMUNITY)
Admission: EM | Admit: 2020-05-07 | Discharge: 2020-05-09 | Disposition: A | Discharge status: Home / Self Care | Payer: 59 | Attending: Emergency Medicine | Admitting: Emergency Medicine

## 2020-05-07 ENCOUNTER — Encounter (HOSPITAL_COMMUNITY): Payer: Self-pay

## 2020-05-07 ENCOUNTER — Emergency Department (HOSPITAL_COMMUNITY): Payer: 59

## 2020-05-07 DIAGNOSIS — Z79899 Other long term (current) drug therapy: Secondary | ICD-10-CM | POA: Diagnosis not present

## 2020-05-07 DIAGNOSIS — R079 Chest pain, unspecified: Secondary | ICD-10-CM | POA: Diagnosis present

## 2020-05-07 DIAGNOSIS — I10 Essential (primary) hypertension: Secondary | ICD-10-CM | POA: Diagnosis present

## 2020-05-07 DIAGNOSIS — I514 Myocarditis, unspecified: Secondary | ICD-10-CM | POA: Diagnosis not present

## 2020-05-07 DIAGNOSIS — E876 Hypokalemia: Secondary | ICD-10-CM | POA: Diagnosis not present

## 2020-05-07 DIAGNOSIS — I200 Unstable angina: Secondary | ICD-10-CM

## 2020-05-07 DIAGNOSIS — Z20822 Contact with and (suspected) exposure to covid-19: Secondary | ICD-10-CM | POA: Diagnosis not present

## 2020-05-07 DIAGNOSIS — R778 Other specified abnormalities of plasma proteins: Secondary | ICD-10-CM

## 2020-05-07 LAB — BASIC METABOLIC PANEL
Anion gap: 11 (ref 5–15)
BUN: 13 mg/dL (ref 6–20)
CO2: 27 mmol/L (ref 22–32)
Calcium: 9.4 mg/dL (ref 8.9–10.3)
Chloride: 102 mmol/L (ref 98–111)
Creatinine, Ser: 1.26 mg/dL — ABNORMAL HIGH (ref 0.61–1.24)
GFR, Estimated: >60 mL/min (ref >60)
Glucose, Bld: 103 mg/dL — ABNORMAL HIGH (ref 70–99)
Potassium: 3.4 mmol/L — ABNORMAL LOW (ref 3.5–5.1)
Sodium: 140 mmol/L (ref 135–145)

## 2020-05-07 LAB — CBC
HCT: 46.5 % (ref 39.0–52.0)
Hemoglobin: 14.3 g/dL (ref 13.0–17.0)
MCH: 25.5 pg — ABNORMAL LOW (ref 26.0–34.0)
MCHC: 30.8 g/dL (ref 30.0–36.0)
MCV: 83 fL (ref 80.0–100.0)
Platelets: 333 10*3/uL (ref 150–400)
RBC: 5.6 MIL/uL (ref 4.22–5.81)
RDW: 13.7 % (ref 11.5–15.5)
WBC: 7.7 10*3/uL (ref 4.0–10.5)
nRBC: 0 % (ref 0.0–0.2)

## 2020-05-07 LAB — TROPONIN I (HIGH SENSITIVITY): Troponin I (High Sensitivity): 60 ng/L — ABNORMAL HIGH (ref <18)

## 2020-05-07 MED ORDER — NITROGLYCERIN 2 % TD OINT
1.0000 [in_us] | TOPICAL_OINTMENT | Freq: Once | TRANSDERMAL | Status: AC
  Administered 2020-05-08: 1 [in_us] via TOPICAL
  Filled 2020-05-07: qty 1

## 2020-05-07 NOTE — ED Provider Notes (Addendum)
[facility] EMERGENCY DEPARTMENT Provider Note  CSN: [id number] Arrival date & time: 05/07/20 2034  Chief Complaint(s) Chest Pain  HPI Leroy Cochran is a 49 y.o. male with a past medical history listed below who presents to the emergency department with 3 weeks of exertional dyspnea and chest pain that has been gradually worsening since onset.  Initially pain would improve with rest however yesterday his pain persisted up until this morning.  Pain is described as an upper chest/neck pressure. Currently pain is mild.   He reports that the symptoms began after having his second Pfizer Covid vaccine.  Denies any prior history of PEs or DVTs.  No unilateral lower extremity edema or pain.  No prior history of heart attacks.  No stress testing or catheterizations.  Patient reports being evaluated by his PCP who noted abnormal EKG today and sent in for evaluation.    HPI  Past Medical History Past Medical History:  Diagnosis Date  . Essential hypertension   . Hyperlipidemia   . Insomnia   . OSA on CPAP    Dr.Turner;; CPAP -1, (RDI=82.7, Low O2 - 78%)   Patient Active Problem List   Diagnosis Date Noted  . Essential hypertension 03/31/2017  . Dyspnea on exertion 03/31/2017  . Fatigue 03/31/2017  . Abnormal electrocardiogram (ECG) (EKG) 03/31/2017   Home Medication(s) Prior to Admission medications   Medication Sig Start Date End Date Taking? Authorizing Provider  cetirizine (ZYRTEC) 10 MG tablet Take 10 mg by mouth. As needed    [provider]  Marya Landry 40-5-12.5 MG TABS Take 1 tablet daily by mouth. 03/08/17   [provider]                                                                                                                                    Past Surgical History Past Surgical History:  Procedure Laterality Date  . CPX - CARDIOPULMONARY EXERCISE TEST  04/17/2017    Limited due to submaximal exercise (did not reach target heart  rate).  Response.  Some shortness of breath related to abnormal relaxation.  No cardiopulmonary limitations.  Limitations more related to deconditioning and body habitus.  . WISDOM TOOTH EXTRACTION     Family History Family History  Problem Relation Age of Onset  . Healthy Father   . Cervical cancer Mother   . Healthy Sister   . Heart attack Maternal Grandfather        In his 28s  . Healthy Daughter     Social History Social History   Tobacco Use  . Smoking status: Never Smoker  . Smokeless tobacco: Never Used  Substance Use Topics  . Alcohol use: Yes    Comment: occasional wine  . Drug use: No   Allergies Patient has no known allergies.  Review of Systems Review of Systems All other systems are reviewed and are negative for acute change except as  noted in the HPI  Physical Exam Vital Signs  I have reviewed the triage vital signs BP (!) 157/92 (BP Location: Right Arm)   Pulse 97   Temp 98.1 F (36.7 C) (Oral)   Resp 16   SpO2 99%   Physical Exam Vitals reviewed.  Constitutional:      General: He is not in acute distress.    Appearance: He is well-developed and well-nourished. He is not diaphoretic.  HENT:     Head: Normocephalic and atraumatic.     Nose: Nose normal.  Eyes:     General: No scleral icterus.       Right eye: No discharge.        Left eye: No discharge.     Extraocular Movements: EOM normal.     Conjunctiva/sclera: Conjunctivae normal.     Pupils: Pupils are equal, round, and reactive to light.  Cardiovascular:     Rate and Rhythm: Normal rate and regular rhythm.     Heart sounds: No murmur heard. No friction rub. No gallop.   Pulmonary:     Effort: Pulmonary effort is normal. No respiratory distress.     Breath sounds: Normal breath sounds. No stridor. No rales.  Abdominal:     General: There is no distension.     Palpations: Abdomen is soft.     Tenderness: There is no abdominal tenderness.  Musculoskeletal:        General: No  tenderness or edema.     Cervical back: Normal range of motion and neck supple.  Skin:    General: Skin is warm and dry.     Findings: No erythema or rash.  Neurological:     Mental Status: He is alert and oriented to person, place, and time.  Psychiatric:        Mood and Affect: Mood and affect normal.     ED Results and Treatments Labs (all labs ordered are listed, but only abnormal results are displayed) Labs Reviewed  BASIC METABOLIC PANEL - Abnormal; Notable for the following components:      Result Value   Potassium 3.4 (*)    Glucose, Bld 103 (*)    Creatinine, Ser 1.26 (*)    All other components within normal limits  CBC - Abnormal; Notable for the following components:   MCH 25.5 (*)    All other components within normal limits  TROPONIN I (HIGH SENSITIVITY) - Abnormal; Notable for the following components:   Troponin I (High Sensitivity) 60 (*)    All other components within normal limits  RESP PANEL BY RT-PCR (FLU A&B, COVID) [id number]  D-DIMER, QUANTITATIVE (NOT AT [facility])  TROPONIN I (HIGH SENSITIVITY)                                                                                                                         EKG  EKG Interpretation  Date/Time:  Thursday May 07 2020 22:59:47 EST Ventricular Rate:  85 PR Interval:  156 QRS Duration: 101 QT Interval:  358 QTC Calculation: 426 R Axis:   36 Text Interpretation: Sinus rhythm Probable left atrial enlargement Probable anterior infarct, age indeterminate Abnormal T, consider ischemia, diffuse leads Lateral leads are also involved since last tracing no significant change Confirmed by Drema Pryardama, Jibran Crookshanks [phone]([id number]) on 05/07/2020 11:01:03 PM      Radiology DG Chest 2 View  Result Date: 05/07/2020 CLINICAL DATA:  Chest pain EXAM: CHEST - 2 VIEW COMPARISON:  06/24/2015 FINDINGS: Heart and mediastinal contours are within normal limits. No focal opacities or effusions. No acute bony abnormality. IMPRESSION: Normal  study. Electronically Signed   By: Charlett NoseKevin  Dover M.D.   On: 05/07/2020 23:35    Pertinent labs & imaging results that were available during my care of the patient were reviewed by me and considered in my medical decision making (see chart for details).  Medications Ordered in ED Medications  nitroGLYCERIN (NITROGLYN) 2 % ointment 1 inch (has no administration in time range)                                                                                                                                    Procedures .1-3 Lead EKG Interpretation Performed by: Nira Connardama, Ngozi Alvidrez Eduardo, MD Authorized by: Nira Connardama, Ally Knodel Eduardo, MD     Interpretation: normal     ECG rate:  91   ECG rate assessment: normal     Rhythm: sinus rhythm     Ectopy: none     Conduction: normal   .Critical Care Performed by: Nira Connardama, Trianna Lupien Eduardo, MD Authorized by: Nira Connardama, Stefany Starace Eduardo, MD   Critical care provider statement:    Critical care time (minutes):  45   Critical care was necessary to treat or prevent imminent or life-threatening deterioration of the following conditions:  Cardiac failure   Critical care was time spent personally by me on the following activities:  Discussions with consultants, evaluation of patient's response to treatment, examination of patient, ordering and performing treatments and interventions, ordering and review of laboratory studies, ordering and review of radiographic studies, pulse oximetry, re-evaluation of patient's condition, obtaining history from patient or surrogate and review of old charts    (including critical care time)  Medical Decision Making / ED Course I have reviewed the nursing notes for this encounter and the patient's prior records (if available in EHR or on provided paperwork).   Judieth KeensMarcus A Torrez was evaluated in Emergency Department on 05/08/2020 for the symptoms described in the history of present illness. He was evaluated in the context of the global  COVID-19 pandemic, which necessitated consideration that the patient might be at risk for infection with the SARS-CoV-2 virus that causes COVID-19. Institutional protocols and algorithms that pertain to the evaluation of patients at risk for COVID-19 are in a state of rapid change based on information released by regulatory bodies including the CDC and federal and state organizations. These policies and  algorithms were followed during the patient's care in the ED.  EKG with TWI/ ST depressions in inferiorlateral leads. no prior for comparison. Symptoms are concerning for ACS.  Unstable angina versus NSTEMI. Initial troponin is elevated.  Given the reported history of symptom onset after Covid vaccine, will obtain a D-dimer to rule out possible PE. Dimer is negative.   Chest x-ray without evidence suggestive of pneumonia, pneumothorax, pneumomediastinum.  No abnormal contour of the mediastinum to suggest dissection. No evidence of acute injuries.  Will consult cardiology.  Cardiology was able to see prior EKGs from 2018 which appear similar to today's tracing.  Second troponin is flat.  Patient's pain improved with nitroglycerin.  We will call medicine for admission     Final Clinical Impression(s) / ED Diagnoses Final diagnoses:  Chest pain  Unstable angina (HCC)  Elevated troponin      This chart was dictated using voice recognition software.  Despite best efforts to proofread,  errors can occur which can change the documentation meaning.     Nira Conn, MD 05/08/20 [phone]

## 2020-05-07 NOTE — ED Triage Notes (Signed)
Pt reports that he has been having CP and SOB for the last few weeks, went to see PCP and told to come here due to possible EKG changes.

## 2020-05-08 ENCOUNTER — Other Ambulatory Visit: Payer: Self-pay

## 2020-05-08 ENCOUNTER — Observation Stay (HOSPITAL_COMMUNITY): Payer: 59

## 2020-05-08 ENCOUNTER — Encounter (HOSPITAL_COMMUNITY): Payer: Self-pay | Admitting: Internal Medicine

## 2020-05-08 ENCOUNTER — Observation Stay (HOSPITAL_BASED_OUTPATIENT_CLINIC_OR_DEPARTMENT_OTHER): Payer: 59

## 2020-05-08 DIAGNOSIS — I200 Unstable angina: Secondary | ICD-10-CM

## 2020-05-08 DIAGNOSIS — I10 Essential (primary) hypertension: Secondary | ICD-10-CM | POA: Diagnosis not present

## 2020-05-08 DIAGNOSIS — R079 Chest pain, unspecified: Secondary | ICD-10-CM | POA: Diagnosis present

## 2020-05-08 DIAGNOSIS — I514 Myocarditis, unspecified: Secondary | ICD-10-CM

## 2020-05-08 DIAGNOSIS — R778 Other specified abnormalities of plasma proteins: Secondary | ICD-10-CM

## 2020-05-08 LAB — CREATININE, SERUM
Creatinine, Ser: 1.13 mg/dL (ref 0.61–1.24)
GFR, Estimated: >60 mL/min (ref >60)

## 2020-05-08 LAB — CBC
HCT: 42.6 % (ref 39.0–52.0)
Hemoglobin: 12.8 g/dL — ABNORMAL LOW (ref 13.0–17.0)
MCH: 25.1 pg — ABNORMAL LOW (ref 26.0–34.0)
MCHC: 30 g/dL (ref 30.0–36.0)
MCV: 83.7 fL (ref 80.0–100.0)
Platelets: 288 10*3/uL (ref 150–400)
RBC: 5.09 MIL/uL (ref 4.22–5.81)
RDW: 13.9 % (ref 11.5–15.5)
WBC: 7.8 10*3/uL (ref 4.0–10.5)
nRBC: 0 % (ref 0.0–0.2)

## 2020-05-08 LAB — BRAIN NATRIURETIC PEPTIDE: B Natriuretic Peptide: 44.7 pg/mL (ref 0.0–100.0)

## 2020-05-08 LAB — TROPONIN I (HIGH SENSITIVITY)
Troponin I (High Sensitivity): 65 ng/L — ABNORMAL HIGH (ref <18)
Troponin I (High Sensitivity): 61 ng/L — ABNORMAL HIGH (ref <18)

## 2020-05-08 LAB — ECHOCARDIOGRAM COMPLETE
AR max vel: 2.76 cm2
AV Area VTI: 2.64 cm2
AV Area mean vel: 2.57 cm2
AV Mean grad: 5 mmHg
AV Peak grad: 9.7 mmHg
Ao pk vel: 1.56 m/s
Area-P 1/2: 3.77 cm2
S' Lateral: 2.6 cm

## 2020-05-08 LAB — C-REACTIVE PROTEIN: CRP: 1.7 mg/dL — ABNORMAL HIGH (ref <1.0)

## 2020-05-08 LAB — HIV ANTIBODY (ROUTINE TESTING W REFLEX): HIV Screen 4th Generation wRfx: NONREACTIVE

## 2020-05-08 LAB — D-DIMER, QUANTITATIVE: D-Dimer, Quant: 0.35 ug/mL-FEU (ref 0.00–0.50)

## 2020-05-08 LAB — RESP PANEL BY RT-PCR (FLU A&B, COVID) ARPGX2
Influenza A by PCR: NEGATIVE
Influenza B by PCR: NEGATIVE
SARS Coronavirus 2 by RT PCR: NEGATIVE

## 2020-05-08 LAB — SEDIMENTATION RATE: Sed Rate: 15 mm/hr (ref 0–16)

## 2020-05-08 MED ORDER — HYDROCODONE-ACETAMINOPHEN 5-325 MG PO TABS: 1.0000 | ORAL_TABLET | ORAL | Status: DC | PRN

## 2020-05-08 MED ORDER — IOHEXOL 350 MG/ML SOLN
80.0000 mL | Freq: Once | INTRAVENOUS | Status: AC | PRN
  Administered 2020-05-08: 80 mL via INTRAVENOUS

## 2020-05-08 MED ORDER — LORAZEPAM 1 MG PO TABS
1.0000 mg | ORAL_TABLET | Freq: Once | ORAL | Status: AC
  Administered 2020-05-08: 1 mg via ORAL
  Filled 2020-05-08: qty 1

## 2020-05-08 MED ORDER — POTASSIUM CHLORIDE CRYS ER 20 MEQ PO TBCR
20.0000 meq | EXTENDED_RELEASE_TABLET | Freq: Once | ORAL | Status: AC
  Administered 2020-05-08: 20 meq via ORAL
  Filled 2020-05-08: qty 1

## 2020-05-08 MED ORDER — GADOBUTROL 1 MMOL/ML IV SOLN
10.0000 mL | Freq: Once | INTRAVENOUS | Status: AC | PRN
  Administered 2020-05-08: 10 mL via INTRAVENOUS

## 2020-05-08 MED ORDER — NITROGLYCERIN 0.4 MG SL SUBL
SUBLINGUAL_TABLET | SUBLINGUAL | Status: AC
  Filled 2020-05-08: qty 2

## 2020-05-08 MED ORDER — PANTOPRAZOLE SODIUM 40 MG PO TBEC
40.0000 mg | DELAYED_RELEASE_TABLET | Freq: Every day | ORAL | Status: DC
  Administered 2020-05-09: 40 mg via ORAL
  Filled 2020-05-08: qty 1

## 2020-05-08 MED ORDER — ENOXAPARIN SODIUM 40 MG/0.4ML ~~LOC~~ SOLN
40.0000 mg | Freq: Every day | SUBCUTANEOUS | Status: DC
  Administered 2020-05-08: 40 mg via SUBCUTANEOUS
  Filled 2020-05-08 (×3): qty 0.4

## 2020-05-08 MED ORDER — AMLODIPINE BESYLATE 5 MG PO TABS
5.0000 mg | ORAL_TABLET | Freq: Every day | ORAL | Status: DC
Start: 2020-05-08 — End: 2020-05-09
  Administered 2020-05-08 – 2020-05-09 (×2): 5 mg via ORAL
  Filled 2020-05-08 (×3): qty 1

## 2020-05-08 MED ORDER — COLCHICINE 0.6 MG PO TABS
0.6000 mg | ORAL_TABLET | Freq: Two times a day (BID) | ORAL | Status: DC
  Administered 2020-05-08 – 2020-05-09 (×2): 0.6 mg via ORAL
  Filled 2020-05-08 (×3): qty 1

## 2020-05-08 MED ORDER — HYDROCHLOROTHIAZIDE 12.5 MG PO CAPS
12.5000 mg | ORAL_CAPSULE | Freq: Every day | ORAL | Status: DC
  Administered 2020-05-08 – 2020-05-09 (×2): 12.5 mg via ORAL
  Filled 2020-05-08 (×3): qty 1

## 2020-05-08 MED ORDER — IRBESARTAN 300 MG PO TABS
300.0000 mg | ORAL_TABLET | Freq: Every day | ORAL | Status: DC
  Administered 2020-05-08 – 2020-05-09 (×2): 300 mg via ORAL
  Filled 2020-05-08 (×2): qty 1

## 2020-05-08 MED ORDER — IBUPROFEN 800 MG PO TABS
800.0000 mg | ORAL_TABLET | Freq: Three times a day (TID) | ORAL | Status: DC
  Filled 2020-05-08: qty 1

## 2020-05-08 MED ORDER — METOPROLOL TARTRATE 25 MG PO TABS
100.0000 mg | ORAL_TABLET | Freq: Once | ORAL | Status: AC
  Administered 2020-05-08: 100 mg via ORAL
  Filled 2020-05-08: qty 4

## 2020-05-08 MED ORDER — ACETAMINOPHEN 325 MG PO TABS: 650.0000 mg | ORAL_TABLET | Freq: Four times a day (QID) | ORAL | Status: DC | PRN

## 2020-05-08 MED ORDER — ASPIRIN EC 81 MG PO TBEC
81.0000 mg | DELAYED_RELEASE_TABLET | Freq: Every day | ORAL | Status: DC
  Administered 2020-05-08 – 2020-05-09 (×2): 81 mg via ORAL
  Filled 2020-05-08 (×3): qty 1

## 2020-05-08 MED ORDER — OLMESARTAN-AMLODIPINE-HCTZ 40-5-12.5 MG PO TABS: 1.0000 | ORAL_TABLET | Freq: Every day | ORAL | Status: DC

## 2020-05-08 NOTE — ED Notes (Signed)
Echo at bedside

## 2020-05-08 NOTE — Progress Notes (Signed)
  Echocardiogram 2D Echocardiogram has been performed.  Gerda Diss 05/08/2020, 8:35 AM

## 2020-05-08 NOTE — ED Notes (Signed)
Pt has now finished a [place] sandwich, applesauce and a gingerale and is aware he is now NPO

## 2020-05-08 NOTE — Progress Notes (Signed)
The patient is a 49 yr old man who presented to [facility] with complaints of dyspnea and chest pain. He has an abnormal EKG at baseline. Echocardiogram demonstrated normal LV function and no wall motion abnormalities. Cardiology has been consulted. They plan to do a Coronary CTA, and to evaluate the patient for paricarditis or possible hypertrophic cardiomyopathy as may be suggested by his EKG. If CTA is negative cardiac MRIO will be considered. I appreciate Cardiology's assistance.  Vital signs are within normal limits. Laboratory demonstrates a mildly elevated creatinine at 1.13, and a normal CBC. CRP is 1.7 and ESR is normal. D Dimer is negative at 0.35.   This patient was admitted by my colleague, Dr. Hal Hope, early this morning.

## 2020-05-08 NOTE — ED Notes (Signed)
Pt went to MRI via stretcher in stable condition

## 2020-05-08 NOTE — Progress Notes (Signed)
Cardiac MRI results preliminarily show concerns for myocarditis.  I discussed the results with Dr. Awilda Bill.  The formal report will be in later this evening or tomorrow.  We will go ahead and treat with colchicine 0.6 twice daily.  No NSAIDs.  His MRI was not as concerning for apical variant hypertrophic cardiomyopathy.  He just has mild concentric LVH.  We will monitor him overnight and likely discharge tomorrow.  We will go over precautions he will need to take over the next 3 to 6 months.  We will observe overnight to make sure there are no arrhythmias.  Gerri Spore T. Flora Lipps, MD [facility]  [address], [address] [place], [place] [address] [phone]  6:13 PM

## 2020-05-08 NOTE — Consult Note (Signed)
Cardiology Consultation:   Patient ID: Leroy Cochran MRN: [id number]; DOB: 10-08-1970  Admit date: 05/07/2020 Date of Consult: 05/08/2020  Primary Care Provider: Mila Palmer, MD [facility] Cardiologist: No primary care provider on file.  [facility] Electrophysiologist:  None    Patient Profile:   Leroy Cochran is a 49 y.o. male with a hx of essential hypertension who is being seen today for the evaluation of chest pain at the request of Dr. Eudelia Cochran.  History of Present Illness:   Leroy Cochran has no known history of cardiac disease outside of hypertension.  He was previously evaluated with an echocardiogram in October 2018 for persistent fatigue.  His echo was notable for normal biventricular function with no evidence of left ventricular hypertrophy.  He also underwent a CPX which was notable for a peak VO2 of 25 and a VE VCO2 slope of 30 although this was a submaximal test.  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   Leroy Cochran this evening tells me that he receives his Covid vaccine a little over 3 weeks ago.  He initially had persistent chills and was quite fatigued.  Over the last couple of weeks he has been getting winded with simple daily activities like walking to the bathroom and climbing a flight of stairs.  This has not worsened over this time period but has been persistent and functionally limiting.  He denies any edema, PND, orthopnea, or abdominal swelling.  When he develops shortness of breath he has also noted a chest pain that seems to start in his left upper chest.  The shortness of breath always seems to precede the chest pain.  He describes the pain as sharp.  It will often last up to 30 minutes and is relieved by rest.  He has not had this chest pain at rest.  Yesterday evening he did have chest pain at rest while laying in bed and this lasted throughout the night with no exacerbating or relieving factors.  He was  concerned that the chest pain did not go away and presented to the emergency department for evaluation.  On arrival to the emergency department he was slightly hypertensive but otherwise hemodynamically stable with normal oxygen saturation.  Past Medical History:  Diagnosis Date  . Essential hypertension   . Hyperlipidemia   . Insomnia   . OSA on CPAP    Dr.Turner;; CPAP -1, (RDI=82.7, Low O2 - 78%)    Past Surgical History:  Procedure Laterality Date  . CPX - CARDIOPULMONARY EXERCISE TEST  04/17/2017    Limited due to submaximal exercise (did not reach target heart rate).  Response.  Some shortness of breath related to abnormal relaxation.  No cardiopulmonary limitations.  Limitations more related to deconditioning and body habitus.  . WISDOM TOOTH EXTRACTION       Home Medications:  Prior to Admission medications   Medication Sig Start Date End Date Taking? Authorizing Provider  cetirizine (ZYRTEC) 10 MG tablet Take 10 mg by mouth. As needed    [provider]  Marya Landry 40-5-12.5 MG TABS Take 1 tablet daily by mouth. 03/08/17   [provider]   Allergies:   No Known Allergies  Social History:   Social History   Socioeconomic History  . Marital status: Married    Spouse name: Not on file  . Number of children: 1  . Years of education: Not on file  . Highest education level: High school graduate  Occupational History  . Not on file  Tobacco Use  . Smoking status: Never Smoker  . Smokeless tobacco: Never Used  Substance and Sexual Activity  . Alcohol use: Yes    Comment: occasional wine  . Drug use: No  . Sexual activity: Not on file  Other Topics Concern  . Not on file  Social History Narrative   He is a divorced father of 1 daughter.  He lives with his daughter.   Social Determinants of Health   Financial Resource Strain: Not on file  Food Insecurity: Not on file  Transportation Needs: Not on file  Physical Activity: Not on file  Stress:  Not on file  Social Connections: Not on file  Intimate Partner Violence: Not on file    Family History:    Family History  Problem Relation Age of Onset  . Healthy Father   . Cervical cancer Mother   . Healthy Sister   . Heart attack Maternal Grandfather        In his 38s  . Healthy Daughter      ROS:  Please see the history of present illness.   All other ROS reviewed and negative.     Physical Exam/Data:   Vitals:   05/07/20 2103 05/08/20 0048  BP: (!) 157/92 (!) 129/96  Pulse: 97 96  Resp: 16 (!) 22  Temp: 98.1 F (36.7 C)   TempSrc: Oral   SpO2: 99% 98%   No intake or output data in the 24 hours ending 05/08/20 0233 Last 3 Weights 11/06/2019 05/04/2017 03/31/2017  Weight (lbs) 256 lb 14.4 oz 261 lb 9.6 oz 260 lb  Weight (kg) 116.529 kg 118.661 kg 117.935 kg     There is no height or weight on file to calculate BMI.  General:  Well nourished, well developed, in no acute distress HEENT: normal Lymph: no adenopathy Neck: no JVD Endocrine:  No thryomegaly Vascular: No carotid bruits; FA pulses 2+ bilaterally without bruits  Cardiac:  normal S1, S2; RRR; no murmur  Lungs:  clear to auscultation bilaterally, no wheezing, rhonchi or rales  Abd: soft, nontender, no hepatomegaly  Ext: no edema Musculoskeletal:  No deformities, BUE and BLE strength normal and equal Skin: warm and dry  Neuro:  CNs 2-12 intact, no focal abnormalities noted Psych:  Normal affect   EKG:  The EKG was personally reviewed and demonstrates:  Normal sinus rhythm, inferolateral T wave inversions similar to those seen in Nov 2018  Relevant CV Studies: TTE (02/2017) - Left ventricle: The cavity size was normal. Wall thickness was  normal. Systolic function was normal. The estimated ejection  fraction was in the range of 60% to 65%. Wall motion was normal;  there were no regional wall motion abnormalities. Left  ventricular diastolic function parameters were normal.   Impressions:    - Normal LV systolic and diastolic function; trace MR and TR.   Laboratory Data:  High Sensitivity Troponin:   Recent Labs  Lab 05/07/20 2114 05/07/20 2308  TROPONINIHS 60* 65*     Chemistry Recent Labs  Lab 05/07/20 2114  NA 140  K 3.4*  CL 102  CO2 27  GLUCOSE 103*  BUN 13  CREATININE 1.26*  CALCIUM 9.4  GFRNONAA >60  ANIONGAP 11    No results for input(s): PROT, ALBUMIN, AST, ALT, ALKPHOS, BILITOT in the last 168 hours. Hematology Recent Labs  Lab 05/07/20 2114  WBC 7.7  RBC 5.60  HGB 14.3  HCT 46.5  MCV  83.0  MCH 25.5*  MCHC 30.8  RDW 13.7  PLT 333   BNPNo results for input(s): BNP, PROBNP in the last 168 hours.  DDimer  Recent Labs  Lab 05/07/20 2308  DDIMER 0.35     Radiology/Studies:  DG Chest 2 View  Result Date: 05/07/2020 CLINICAL DATA:  Chest pain EXAM: CHEST - 2 VIEW COMPARISON:  06/24/2015 FINDINGS: Heart and mediastinal contours are within normal limits. No focal opacities or effusions. No acute bony abnormality. IMPRESSION: Normal study. Electronically Signed   By: Charlett Nose M.D.   On: 05/07/2020 23:35     Assessment and Plan:   Mr. Markgraf is presenting this evening with multiple weeks of chest pain and shortness of breath.  He does relate this timing to his recent Covid vaccine although clinically there is no evidence of myocarditis.  In terms of cardiac risk factors he does have a history of hypertension that appears to be suboptimally controlled.  He does not have any significant family history of cardiac disease.  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  His chest pain description is rather atypical with sharp upper left chest pain that does develop with exertion and is relieved by rest however most recently has lasted for hours.  On his EKG there are inferolateral ST depressions which are similar to those seen in 2018 and I suspect are likely related to hypertensive heart disease.  His troponin is  slightly elevated but flat.  I do think it would be reasonable to repeat an echocardiogram in the morning as well as performed stress testing as he is at intermediate risk for coronary artery disease.  I would prefer to walk him on the treadmill with imaging if he is able.  If he is not able to walk on the treadmill a CTA would also be informative.  We will plan to observe him overnight and perform this testing in the morning.  Please page with any questions.  HEAR Score (for undifferentiated chest pain): 4  For questions or updates, please contact [facility] Please consult [email or web address] for contact info under   Signed, Livingston Diones, MD  05/08/2020 2:33 AM

## 2020-05-08 NOTE — H&P (Signed)
History and Physical    SEM MCCAUGHEY BJS:[id number] DOB: 27-Jun-1970 DOA: 05/07/2020  PCP: Mila Palmer, MD  Patient coming from: Home.  Chief Complaint: Chest pain.  HPI: Leroy Cochran is a 49 y.o. male with history of hypertension presents to the ER with complaints of chest pain.  Patient has been having chest pain off and on for last 3 weeks.  Chest pain is associated with shortness of breath and happens on exertion.  Pain is mostly around the anterior chest wall stabbing in nature with no productive cough fever or chills.  Had received Covid vaccination second dose from Pfizer vaccine about 3 weeks ago.  Patient had followed up with his primary care physician and was referred to the ER.  In 2018 patient had stress test which was unremarkable.  ED Course: In the ER patient still had mild chest pain which was resolved after nitroglycerin.  Cardiac markers I sensitive troponin was 1635 EKG shows changes which are compatible with the old EKG in 2018.  Cardiology has been consulted patient admitted for further management.  Labs also significant for mild hypokalemia.  D-dimer was negative.  Covid test was negative.  Review of Systems: As per HPI, rest all negative.   Past Medical History:  Diagnosis Date  . Essential hypertension   . Hyperlipidemia   . Insomnia   . OSA on CPAP    Dr.Turner;; CPAP -1, (RDI=82.7, Low O2 - 78%)    Past Surgical History:  Procedure Laterality Date  . CPX - CARDIOPULMONARY EXERCISE TEST  04/17/2017    Limited due to submaximal exercise (did not reach target heart rate).  Response.  Some shortness of breath related to abnormal relaxation.  No cardiopulmonary limitations.  Limitations more related to deconditioning and body habitus.  . WISDOM TOOTH EXTRACTION       reports that he has never smoked. He has never used smokeless tobacco. He reports current alcohol use. He reports that he does not use drugs.  No Known Allergies  Family History   Problem Relation Age of Onset  . Healthy Father   . Cervical cancer Mother   . Healthy Sister   . Heart attack Maternal Grandfather        In his 3s  . Healthy Daughter     Prior to Admission medications   Medication Sig Start Date End Date Taking? Authorizing Provider  cetirizine (ZYRTEC) 10 MG tablet Take 10 mg by mouth. As needed    [provider]  TRIBENZOR 40-5-12.5 MG TABS Take 1 tablet daily by mouth. 03/08/17   [provider]    Physical Exam: Constitutional: Moderately built and nourished. Vitals:   05/07/20 2103 05/08/20 0048  BP: (!) 157/92 (!) 129/96  Pulse: 97 96  Resp: 16 (!) 22  Temp: 98.1 F (36.7 C)   TempSrc: Oral   SpO2: 99% 98%   Eyes: Anicteric no pallor. ENMT: No discharge from the ears eyes nose or mouth. Neck: No mass felt.  No neck rigidity. Respiratory: No rhonchi or crepitations. Cardiovascular: S1-S2 heard. Abdomen: Soft nontender bowel sounds present. Musculoskeletal: No edema. Skin: No rash. Neurologic: Alert awake oriented to time place and person.  Moves all extremities. Psychiatric: Appears normal.  Normal affect.   Labs on Admission: I have personally reviewed following labs and imaging studies  CBC: Recent Labs  Lab 05/07/20 2114  WBC 7.7  HGB 14.3  HCT 46.5  MCV 83.0  PLT 333   Basic Metabolic Panel: Recent  Labs  Lab 05/07/20 2114  NA 140  K 3.4*  CL 102  CO2 27  GLUCOSE 103*  BUN 13  CREATININE 1.26*  CALCIUM 9.4   GFR: CrCl cannot be calculated (Unknown ideal weight.). Liver Function Tests: No results for input(s): AST, ALT, ALKPHOS, BILITOT, PROT, ALBUMIN in the last 168 hours. No results for input(s): LIPASE, AMYLASE in the last 168 hours. No results for input(s): AMMONIA in the last 168 hours. Coagulation Profile: No results for input(s): INR, PROTIME in the last 168 hours. Cardiac Enzymes: No results for input(s): CKTOTAL, CKMB, CKMBINDEX, TROPONINI in the last 168 hours. BNP  (last 3 results) No results for input(s): PROBNP in the last 8760 hours. HbA1C: No results for input(s): HGBA1C in the last 72 hours. CBG: No results for input(s): GLUCAP in the last 168 hours. Lipid Profile: No results for input(s): CHOL, HDL, LDLCALC, TRIG, CHOLHDL, LDLDIRECT in the last 72 hours. Thyroid Function Tests: No results for input(s): TSH, T4TOTAL, FREET4, T3FREE, THYROIDAB in the last 72 hours. Anemia Panel: No results for input(s): VITAMINB12, FOLATE, FERRITIN, TIBC, IRON, RETICCTPCT in the last 72 hours. Urine analysis: No results found for: COLORURINE, APPEARANCEUR, LABSPEC, PHURINE, GLUCOSEU, HGBUR, BILIRUBINUR, KETONESUR, PROTEINUR, UROBILINOGEN, NITRITE, LEUKOCYTESUR Sepsis Labs: @LABRCNTIP (procalcitonin:4,lacticidven:4) ) Recent Results (from the past 240 hour(s))  Resp Panel by RT-PCR (Flu A&B, Covid) Nasopharyngeal Swab     Status: None   Collection Time: 05/08/20 12:51 AM   Specimen: Nasopharyngeal Swab; Nasopharyngeal(NP) swabs in vial transport medium  Result Value Ref Range Status   SARS Coronavirus 2 by RT PCR NEGATIVE NEGATIVE Final    Comment: (NOTE) SARS-CoV-2 target nucleic acids are NOT DETECTED.  The SARS-CoV-2 RNA is generally detectable in upper respiratory specimens during the acute phase of infection. The lowest concentration of SARS-CoV-2 viral copies this assay can detect is 138 copies/mL. A negative result does not preclude SARS-Cov-2 infection and should not be used as the sole basis for treatment or other patient management decisions. A negative result may occur with  improper specimen collection/handling, submission of specimen other than nasopharyngeal swab, presence of viral mutation(s) within the areas targeted by this assay, and inadequate number of viral copies(<138 copies/mL). A negative result must be combined with clinical observations, patient history, and epidemiological information. The expected result is Negative.  Fact  Sheet for Patients:  05/10/20  Fact Sheet for Healthcare Providers:  BloggerCourse.com  This test is no t yet approved or cleared by the [employer]iousBroker.[place] FDA and  has been authorized for detection and/or diagnosis of SARS-CoV-2 by FDA under an Emergency Use Authorization (EUA). This EUA will remain  in effect (meaning this test can be used) for the duration of the COVID-19 declaration under Section 564(b)(1) of the Act, 21 U.S.C.section 360bbb-3(b)(1), unless the authorization is terminated  or revoked sooner.       Influenza A by PCR NEGATIVE NEGATIVE Final   Influenza B by PCR NEGATIVE NEGATIVE Final    Comment: (NOTE) The Xpert Xpress SARS-CoV-2/FLU/RSV plus assay is intended as an aid in the diagnosis of influenza from Nasopharyngeal swab specimens and should not be used as a sole basis for treatment. Nasal washings and aspirates are unacceptable for Xpert Xpress SARS-CoV-2/FLU/RSV testing.  Fact Sheet for Patients: [place]  Fact Sheet for Healthcare Providers: BloggerCourse.com  This test is not yet approved or cleared by the SeriousBroker.it FDA and has been authorized for detection and/or diagnosis of SARS-CoV-2 by FDA under an Emergency Use Authorization (EUA). This EUA will  remain in effect (meaning this test can be used) for the duration of the COVID-19 declaration under Section 564(b)(1) of the Act, 21 U.S.C. section 360bbb-3(b)(1), unless the authorization is terminated or revoked.  Performed at [facility], [address] [place], [place] [address]      Radiological Exams on Admission: DG Chest 2 View  Result Date: 05/07/2020 CLINICAL DATA:  Chest pain EXAM: CHEST - 2 VIEW COMPARISON:  06/24/2015 FINDINGS: Heart and mediastinal contours are within normal limits. No focal opacities or effusions. No acute bony abnormality. IMPRESSION: Normal  study. Electronically Signed   By: Charlett Nose M.D.   On: 05/07/2020 23:35    EKG: Independently reviewed.  Normal sinus rhythm with nonspecific ST changes.  Assessment/Plan Principal Problem:   Chest pain Active Problems:   Essential hypertension    1. Chest pain -appreciate cardiology consult.  Plan is to have possible stress test in the morning for which patient be kept n.p.o. past midnight.  Check a cardiac markers.  Aspirin.  Nitroglycerin. 2. Hypertension we will continue home medication. 3. Mild hypokalemia likely from diuretic.  Replace recheck.   DVT prophylaxis: Lovenox. Code Status: Full code. Family Communication: Discussed with patient. Disposition Plan: Home. Consults called: Cardiology. Admission status: Observation.   Eduard Clos MD [facility] Pager [phone].  If 7PM-7AM, please contact night-coverage [email or web address] Password [email or web address]  05/08/2020, 3:07 AM

## 2020-05-08 NOTE — ED Notes (Signed)
Patient returned from MRI.

## 2020-05-08 NOTE — Progress Notes (Signed)
Cardiology Progress Note  Patient ID: Leroy Cochran MRN: [id number] DOB: Jun 22, 1970 Date of Encounter: 05/08/2020  Primary Cardiologist: No primary care provider on file.  Subjective   Chief Complaint: Chest pain improved.  HPI: Admitted with chest pain.  Troponins are flat.  They are mildly abnormal.  EKG unchanged from prior.  Coronary CTA ordered this morning.  ROS:  All other ROS reviewed and negative. Pertinent positives noted in the HPI.     Inpatient Medications  Scheduled Meds:  irbesartan  300 mg Oral Daily   And   amLODipine  5 mg Oral Daily   And   hydrochlorothiazide  12.5 mg Oral Daily   aspirin EC  81 mg Oral Daily   enoxaparin (LOVENOX) injection  40 mg Subcutaneous Daily   Continuous Infusions:  PRN Meds:    Vital Signs   Vitals:   05/08/20 0300 05/08/20 0400 05/08/20 0519 05/08/20 0802  BP: 126/82 127/74 116/61 136/65  Pulse: 82 81 72 85  Resp: 17 18 20 14   Temp:  97.8 F (36.6 C)  97.9 F (36.6 C)  TempSrc:  Oral  Oral  SpO2: 96% 97% 94% 96%  Weight:    115.7 kg  Height:    6' (1.829 m)   No intake or output data in the 24 hours ending 05/08/20 0843 Last 3 Weights 05/08/2020 11/06/2019 05/04/2017  Weight (lbs) 255 lb 256 lb 14.4 oz 261 lb 9.6 oz  Weight (kg) 115.667 kg 116.529 kg 118.661 kg      Telemetry  Overnight telemetry shows normal sinus rhythm heart rate in the 70s, which I personally reviewed.   ECG  The most recent ECG shows normal sinus rhythm, heart rate 85, LVH with likely repolarization abnormalities in the inferolateral leads, which I personally reviewed.   Physical Exam   Vitals:   05/08/20 0300 05/08/20 0400 05/08/20 0519 05/08/20 0802  BP: 126/82 127/74 116/61 136/65  Pulse: 82 81 72 85  Resp: 17 18 20 14   Temp:  97.8 F (36.6 C)  97.9 F (36.6 C)  TempSrc:  Oral  Oral  SpO2: 96% 97% 94% 96%  Weight:    115.7 kg  Height:    6' (1.829 m)   No intake or output data in the 24 hours ending 05/08/20 0843   Last 3 Weights 05/08/2020 11/06/2019 05/04/2017  Weight (lbs) 255 lb 256 lb 14.4 oz 261 lb 9.6 oz  Weight (kg) 115.667 kg 116.529 kg 118.661 kg    Body mass index is 34.58 kg/m.  General: Well nourished, well developed, in no acute distress Head: Atraumatic, normal size  Eyes: PEERLA, EOMI  Neck: Supple, no JVD Endocrine: No thryomegaly Cardiac: Normal S1, S2; RRR; no murmurs, rubs, or gallops Lungs: Clear to auscultation bilaterally, no wheezing, rhonchi or rales  Abd: Soft, nontender, no hepatomegaly  Ext: No edema, pulses 2+ Musculoskeletal: No deformities, BUE and BLE strength normal and equal Skin: Warm and dry, no rashes   Neuro: Alert and oriented to person, place, time, and situation, CNII-XII grossly intact, no focal deficits  Psych: Normal mood and affect   Labs  High Sensitivity Troponin:   Recent Labs  Lab 05/07/20 2114 05/07/20 2308 05/08/20 0624  TROPONINIHS 60* 65* 61*     Cardiac EnzymesNo results for input(s): TROPONINI in the last 168 hours. No results for input(s): TROPIPOC in the last 168 hours.  Chemistry Recent Labs  Lab 05/07/20 2114 05/08/20 0624  NA 140  --   K 3.4*  --  CL 102  --   CO2 27  --   GLUCOSE 103*  --   BUN 13  --   CREATININE 1.26* 1.13  CALCIUM 9.4  --   GFRNONAA >60 >60  ANIONGAP 11  --     Hematology Recent Labs  Lab 05/07/20 2114 05/08/20 0624  WBC 7.7 7.8  RBC 5.60 5.09  HGB 14.3 12.8*  HCT 46.5 42.6  MCV 83.0 83.7  MCH 25.5* 25.1*  MCHC 30.8 30.0  RDW 13.7 13.9  PLT 333 288   BNPNo results for input(s): BNP, PROBNP in the last 168 hours.  DDimer  Recent Labs  Lab 05/07/20 2308  DDIMER 0.35     Radiology  DG Chest 2 View  Result Date: 05/07/2020 CLINICAL DATA:  Chest pain EXAM: CHEST - 2 VIEW COMPARISON:  06/24/2015 FINDINGS: Heart and mediastinal contours are within normal limits. No focal opacities or effusions. No acute bony abnormality. IMPRESSION: Normal study. Electronically Signed   By: Charlett Nose M.D.   On: 05/07/2020 23:35    Cardiac Studies   Echo mild to moderate LVH with normal LVEF, 60 to 65%, no regional wall motion abnormalities  Patient Profile  Leroy Cochran is a 49 y.o. male with hypertension who was admitted on 05/07/2020 with exertional shortness of breath and chest pain.  Assessment & Plan   1.  Exertional shortness of breath or chest pain -Describes sharp chest pain that comes after being short of breath with exertion.  Did get his Veatrice Kells shot roughly 3 weeks ago. -EKG with inferolateral T wave inversions which are unchanged from prior.  History of hypertension. -Echo this morning with normal LVEF and no wall motion normalities. 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  Apex not that well visualized on echo. -I have recommended coronary CTA for further evaluation.  We will proceed with at this morning.  Please ensure he has an 18-gauge needle.  He was given 100 mg of metoprolol tartrate this morning. -His symptoms could also be myocarditis related.  If his CTA is negative we may need to pursue cardiac MRI. -D-dimer ruled out PE. -I have added on a BNP to further exclude heart failure.  IVC is collapsible on echo.  I also ordered a sed rate and CRP to exclude pericarditis.  Symptoms not classic for this. EKG not classic either.   For questions or updates, please contact [facility] Please consult [email or web address] for contact info under   Time Spent with Patient: I have spent a total of 25 minutes with patient reviewing hospital notes, telemetry, EKGs, labs and examining the patient as well as establishing an assessment and plan that was discussed with the patient.  > 50% of time was spent in direct patient care.    Signed, Lenna Gilford. Flora Lipps, MD [facility]   [facility]  05/08/2020 8:43 AM

## 2020-05-09 DIAGNOSIS — I10 Essential (primary) hypertension: Secondary | ICD-10-CM | POA: Diagnosis not present

## 2020-05-09 DIAGNOSIS — R778 Other specified abnormalities of plasma proteins: Secondary | ICD-10-CM | POA: Diagnosis not present

## 2020-05-09 DIAGNOSIS — I200 Unstable angina: Secondary | ICD-10-CM | POA: Diagnosis not present

## 2020-05-09 DIAGNOSIS — R079 Chest pain, unspecified: Secondary | ICD-10-CM | POA: Diagnosis not present

## 2020-05-09 LAB — BASIC METABOLIC PANEL
Anion gap: 9 (ref 5–15)
BUN: 14 mg/dL (ref 6–20)
CO2: 27 mmol/L (ref 22–32)
Calcium: 8.6 mg/dL — ABNORMAL LOW (ref 8.9–10.3)
Chloride: 103 mmol/L (ref 98–111)
Creatinine, Ser: 1.25 mg/dL — ABNORMAL HIGH (ref 0.61–1.24)
GFR, Estimated: >60 mL/min (ref >60)
Glucose, Bld: 102 mg/dL — ABNORMAL HIGH (ref 70–99)
Potassium: 3.6 mmol/L (ref 3.5–5.1)
Sodium: 139 mmol/L (ref 135–145)

## 2020-05-09 MED ORDER — PANTOPRAZOLE SODIUM 40 MG PO TBEC: 40.0000 mg | DELAYED_RELEASE_TABLET | Freq: Every day | ORAL | 0 refills | Status: DC

## 2020-05-09 MED ORDER — HYDROCHLOROTHIAZIDE 12.5 MG PO CAPS: 12.5000 mg | ORAL_CAPSULE | Freq: Every day | ORAL | 0 refills | Status: DC

## 2020-05-09 MED ORDER — AMLODIPINE BESYLATE 5 MG PO TABS: 5.0000 mg | ORAL_TABLET | Freq: Every day | ORAL | 0 refills | Status: DC

## 2020-05-09 MED ORDER — COLCHICINE 0.6 MG PO TABS: 0.6000 mg | ORAL_TABLET | Freq: Two times a day (BID) | ORAL | 0 refills | Status: DC

## 2020-05-09 MED ORDER — IRBESARTAN 300 MG PO TABS: 300.0000 mg | ORAL_TABLET | Freq: Every day | ORAL | 0 refills | Status: DC

## 2020-05-09 NOTE — Progress Notes (Signed)
Cardiology Progress Note  Patient ID: Leroy Cochran MRN: [id number] DOB: Dec 31, 1970 Date of Encounter: 05/09/2020  Primary Cardiologist: No primary care provider on file.  Subjective   Chief Complaint: None.   HPI: MR with myocarditis. Started on colchicine. DC today.   ROS:  All other ROS reviewed and negative. Pertinent positives noted in the HPI.     Inpatient Medications  Scheduled Meds: . irbesartan  300 mg Oral Daily   And  . amLODipine  5 mg Oral Daily   And  . hydrochlorothiazide  12.5 mg Oral Daily  . aspirin EC  81 mg Oral Daily  . colchicine  0.6 mg Oral BID  . enoxaparin (LOVENOX) injection  40 mg Subcutaneous Daily  . pantoprazole  40 mg Oral Daily   Continuous Infusions:  PRN Meds: acetaminophen, HYDROcodone-acetaminophen   Vital Signs   Vitals:   05/08/20 1752 05/08/20 1842 05/08/20 2357 05/09/20 0457  BP: 128/85 (!) 142/94 (!) 143/81 130/80  Pulse: 97 88 78 77  Resp: 20 18 20 18   Temp:  99.1 F (37.3 C) 98.3 F (36.8 C) 98.7 F (37.1 C)  TempSrc:  Oral Oral Oral  SpO2: 100% 100% 99% 98%  Weight:  113.1 kg    Height:  (!) 6" (0.152 m)      Intake/Output Summary (Last 24 hours) at 05/09/2020 0851 Last data filed at 05/08/2020 2315 Gross per 24 hour  Intake 720 ml  Output --  Net 720 ml   Last 3 Weights 05/08/2020 05/08/2020 11/06/2019  Weight (lbs) 249 lb 5.4 oz 255 lb 256 lb 14.4 oz  Weight (kg) 113.1 kg 115.667 kg 116.529 kg      Telemetry  Overnight telemetry shows SR 70-80 bpm, which I personally reviewed.   ECG  The most recent ECG shows SR  LVH with repol, which I personally reviewed.   Physical Exam   Vitals:   05/08/20 1752 05/08/20 1842 05/08/20 2357 05/09/20 0457  BP: 128/85 (!) 142/94 (!) 143/81 130/80  Pulse: 97 88 78 77  Resp: 20 18 20 18   Temp:  99.1 F (37.3 C) 98.3 F (36.8 C) 98.7 F (37.1 C)  TempSrc:  Oral Oral Oral  SpO2: 100% 100% 99% 98%  Weight:  113.1 kg    Height:  (!) 6" (0.152 m)        Intake/Output Summary (Last 24 hours) at 05/09/2020 0851 Last data filed at 05/08/2020 2315 Gross per 24 hour  Intake 720 ml  Output --  Net 720 ml    Last 3 Weights 05/08/2020 05/08/2020 11/06/2019  Weight (lbs) 249 lb 5.4 oz 255 lb 256 lb 14.4 oz  Weight (kg) 113.1 kg 115.667 kg 116.529 kg    Body mass index is 4,869.59 kg/m.  General: Well nourished, well developed, in no acute distress Head: Atraumatic, normal size  Eyes: PEERLA, EOMI  Neck: Supple, no JVD Endocrine: No thryomegaly Cardiac: Normal S1, S2; RRR; no murmurs, rubs, or gallops Lungs: Clear to auscultation bilaterally, no wheezing, rhonchi or rales  Abd: Soft, nontender, no hepatomegaly  Ext: No edema, pulses 2+ Musculoskeletal: No deformities, BUE and BLE strength normal and equal Skin: Warm and dry, no rashes   Neuro: Alert and oriented to person, place, time, and situation, CNII-XII grossly intact, no focal deficits  Psych: Normal mood and affect   Labs  High Sensitivity Troponin:   Recent Labs  Lab 05/07/20 2114 05/07/20 2308 05/08/20 0624  TROPONINIHS 60* 65* 61*     Cardiac  EnzymesNo results for input(s): TROPONINI in the last 168 hours. No results for input(s): TROPIPOC in the last 168 hours.  Chemistry Recent Labs  Lab 05/07/20 2114 05/08/20 0624 05/09/20 0436  NA 140  --  139  K 3.4*  --  3.6  CL 102  --  103  CO2 27  --  27  GLUCOSE 103*  --  102*  BUN 13  --  14  CREATININE 1.26* 1.13 1.25*  CALCIUM 9.4  --  8.6*  GFRNONAA >60 >60 >60  ANIONGAP 11  --  9    Hematology Recent Labs  Lab 05/07/20 2114 05/08/20 0624  WBC 7.7 7.8  RBC 5.60 5.09  HGB 14.3 12.8*  HCT 46.5 42.6  MCV 83.0 83.7  MCH 25.5* 25.1*  MCHC 30.8 30.0  RDW 13.7 13.9  PLT 333 288   BNP Recent Labs  Lab 05/08/20 0855  BNP 44.7    DDimer  Recent Labs  Lab 05/07/20 2308  DDIMER 0.35     Radiology  DG Chest 2 View  Result Date: 05/07/2020 CLINICAL DATA:  Chest pain EXAM: CHEST - 2 VIEW  COMPARISON:  06/24/2015 FINDINGS: Heart and mediastinal contours are within normal limits. No focal opacities or effusions. No acute bony abnormality. IMPRESSION: Normal study. Electronically Signed   By: Charlett Nose M.D.   On: 05/07/2020 23:35   CT CORONARY MORPH W/CTA COR W/SCORE W/CA W/CM &/OR WO/CM  Addendum Date: 05/08/2020   ADDENDUM REPORT: 05/08/2020 11:30 CLINICAL DATA:  Chest pain EXAM: Cardiac/Coronary CTA TECHNIQUE: The patient was scanned on a [employer]. A 100 kV prospective scan was triggered in the descending thoracic aorta at 111 HU's. Axial non-contrast 3 mm slices were carried out through the heart. The data set was analyzed on a dedicated work station and scored using the Agatson method. Gantry rotation speed was 250 msecs and collimation was .6 mm. No beta blockade and 0.8 mg of sl NTG was given. The 3D data set was reconstructed in 5% intervals of the 35-75 % of the R-R cycle. Diastolic phases were analyzed on a dedicated work station using MPR, MIP and VRT modes. The patient received 80 cc of contrast. FINDINGS: Image quality: excellent. Noise artifact is: Limited. Coronary Arteries:  Normal coronary origin.  Right dominance. Left main: The left main is a large caliber vessel with a normal take off from the left coronary cusp that trifurcates into a LAD, LCX, and ramus intermedius. There is no plaque or stenosis. Left anterior descending artery: The LAD is patent without evidence of plaque or stenosis. 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 The LAD gives off 2 patent diagonal branches. The LAD is a large wrap around vessel and supplies the distal 1/3 of the posterior left ventricle. Ramus intermedius: Patent with no evidence of plaque or stenosis. Left circumflex artery: The LCX is non-dominant and patent with no evidence of plaque or stenosis. The LCX gives off 2 patent obtuse marginal branches and a patent PLV branch. Right coronary artery: The RCA is dominant with normal  take off from the right coronary cusp. There is no evidence of plaque or stenosis. The RCA terminates as a PDA without evidence of plaque or stenosis. Right Atrium: Right atrial size is within normal limits. Right Ventricle: The right ventricular cavity is within normal limits. Left Atrium: Left atrial size is normal in size with no left atrial appendage filling defect. Left Ventricle: The ventricular cavity size is within normal limits. There  are no stigmata of prior infarction. There is no abnormal filling defect. 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 No apical aneurysm seen. Pulmonary arteries: Normal in size without proximal filling defect. Pulmonary veins: Normal pulmonary venous drainage. Pericardium: Normal thickness with no significant effusion or calcium present. Cardiac valves: The aortic valve is trileaflet without significant calcification. The mitral valve is normal structure without significant calcification. Aorta: Normal caliber with no significant disease. Extra-cardiac findings: See attached radiology report for non-cardiac structures. IMPRESSION: 1. Coronary calcium score of 0. 2. Normal coronary origin with right dominance. 3. Normal coronary arteries. 4. Mid LAD myocardial bridge (normal variant). 5. 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 RECOMMENDATIONS: 1. No evidence of CAD (0%). Consider non-atherosclerotic causes of chest pain. 2. Cardiac MRI is recommended to exclude myocarditis and for further evaluation of apical hypertrophic cardiomyopathy. Lennie Odor, MD Electronically Signed   By: Lennie Odor   On: 05/08/2020 11:30   Result Date: 05/08/2020 EXAM: OVER-READ INTERPRETATION  CT CHEST The following report is an over-read performed by radiologist Dr. Trudie Reed of [facility]  [place] on 05/08/2020. This over-read does not include interpretation of cardiac or coronary anatomy or pathology. The coronary calcium score/coronary CTA interpretation by the cardiologist is attached. COMPARISON:  None. FINDINGS: Within the visualized portions of the thorax there are no suspicious appearing pulmonary nodules or masses, there is no acute consolidative airspace disease, no pleural effusions, no pneumothorax and no lymphadenopathy. Visualized portions of the upper abdomen are unremarkable. There are no aggressive appearing lytic or blastic lesions noted in the visualized portions of the skeleton. IMPRESSION: No significant incidental noncardiac findings are noted. Electronically Signed: By: Trudie Reed M.D. On: 05/08/2020 10:36   MR CARDIAC MORPHOLOGY W WO CONTRAST  Result Date: 05/08/2020 CLINICAL DATA:  Troponin elevation EXAM: CARDIAC MRI TECHNIQUE: The patient was scanned on a 1.5 Tesla Siemens magnet. A dedicated cardiac coil was used. Functional imaging was done using [employer] sequences. 2,3, and 4 chamber views were done to assess for RWMA's. Modified Simpson's rule using a short axis stack was used to calculate an ejection fraction on a dedicated work [employer]. The patient received 10 cc of Gadavist. After 10 minutes inversion recovery sequences were used to assess for infiltration and scar tissue. CONTRAST:  10 cc  of Gadavist FINDINGS: Left ventricle: -Normal size -Moderate concentric hypertrophy -Normal systolic function -Elevated native T1 (1148 ms), T2 (60 ms) and ECV (32%) in mid anterior wall -Midwall LGE in mid anterior wall LV EF: 61% (Normal 56-78%) Absolute volumes: LV EDV: (Normal 77-195 mL) LV ESV: 11mL (Normal 19-72 mL) LV SV: (Normal 51-133 mL) CO: 9.1L/min (Normal 2.8-8.8 L/min) Indexed volumes: LV EDV: 31mL/sq-m (Normal 47-92 mL/sq-m) LV ESV: 35mL/sq-m (Normal 13-30 mL/sq-m) LV SV: 57mL/sq-m (Normal 32-62 mL/sq-m) CI: 3.8L/min/sq-m (Normal  1.7-4.2 L/min/sq-m) Right ventricle: Normal size and systolic function RV EF:  57% (Normal 47-74%) Absolute volumes: RV EDV: (Normal 88-227 mL) RV ESV: 55mL (Normal 23-103 mL) RV SV: (Normal 52-138 mL) CO: 9.4L/min (Normal 2.8-8.8 L/min) Indexed volumes: RV EDV: 61mL/sq-m (Normal 55-105 mL/sq-m) RV ESV: 32mL/sq-m (Normal 15-43 mL/sq-m) RV SV: 24mL/sq-m (Normal 32-64 mL/sq-m) CI: 3.9L/min/sq-m (Normal 1.7-4.2 L/min/sq-m) Left atrium: Mild enlargement Right atrium: Mild enlargement Mitral valve: Trivial regurgitation Aortic valve: Tricuspid.   Trivial regurgitation Tricuspid valve: Trivial regurgitation Pulmonic valve:  No regurgitation Aorta: Normal proximal ascending aorta Pericardium: Normal IMPRESSION: 1. Findings consistent with acute myocarditis, including elevated native T1/T2/ECV and midwall late gadolinium enhancement in the mid anterior wall 2. Normal LV size, moderate concentric hypertrophy, and normal systolic function (EF 61%) 3.  Normal RV size and systolic function (EF 57%) Electronically Signed   By: Epifanio Lesches MD   On: 05/08/2020 19:42   ECHOCARDIOGRAM COMPLETE  Result Date: 05/08/2020    ECHOCARDIOGRAM REPORT   Patient Name:   Sava A Sciarra Date of Exam: 05/08/2020 Medical Rec #:  [id number]       Height:       71.0 in Accession #:    [id number]      Weight:       256.9 lb Date of Birth:  [birth year]       BSA:          2.345 m Patient Age:    49 years        BP:           135/65 mmHg Patient Gender: M               HR:           59 bpm. Exam Location:  Inpatient Procedure: 2D Echo, Cardiac Doppler and Color Doppler Indications:    Chest pain  History:        Patient has prior history of Echocardiogram examinations, most                 recent 03/20/2017. Risk Factors:Sleep Apnea, Dyslipidemia and                 Hypertension.  Sonographer:    Ross Ludwig RDCS (AE) Referring Phys: [id number] Ronnald Ramp O'NEAL IMPRESSIONS  1. Left ventricular ejection fraction, by estimation,  is 70 to 75%. The left ventricle has hyperdynamic function. The left ventricle has no regional wall motion abnormalities. There is severe concentric left ventricular hypertrophy. Left ventricular diastolic parameters are indeterminate.  2. Right ventricular systolic function is normal. The right ventricular size is normal.  3. Left atrial size was moderately dilated.  4. The mitral valve is normal in structure. Trivial mitral valve regurgitation. No evidence of mitral stenosis.  5. The aortic valve is tricuspid. Aortic valve regurgitation is not visualized. No aortic stenosis is present.  6. The inferior vena cava is dilated in size with <50% respiratory variability, suggesting right atrial pressure of 15 mmHg. Comparison(s): No significant change from prior study. Prior images reviewed side by side. Appears to have some LVH on prior study (not commented on) when compared side by side. Conclusion(s)/Recommendation(s): Normal biventricular function without evidence of hemodynamically significant valvular heart disease. FINDINGS  Left Ventricle: Left ventricular ejection fraction, by estimation, is 70 to 75%. The left ventricle has hyperdynamic function. The left ventricle has no regional wall motion abnormalities. The left ventricular internal cavity size was normal in size. There is severe concentric left ventricular hypertrophy. Left ventricular diastolic parameters are indeterminate. Right Ventricle: The right ventricular size is normal. No increase in right ventricular wall thickness. Right ventricular systolic function is normal. Left Atrium: Left atrial size was moderately dilated. Right Atrium: Right atrial size was normal in size. Pericardium: There is no evidence of pericardial effusion. Mitral Valve: The mitral valve is normal in structure. Trivial mitral valve regurgitation. No evidence of mitral valve stenosis. Tricuspid Valve: The tricuspid valve is normal in structure. Tricuspid valve regurgitation is  trivial. No evidence of  tricuspid stenosis. Aortic Valve: The aortic valve is tricuspid. Aortic valve regurgitation is not visualized. No aortic stenosis is present. Aortic valve mean gradient measures 5.0 mmHg. Aortic valve peak gradient measures 9.7 mmHg. Aortic valve area, by VTI measures 2.64 cm. Pulmonic Valve: The pulmonic valve was grossly normal. Pulmonic valve regurgitation is not visualized. Aorta: The aortic root, ascending aorta and aortic arch are all structurally normal, with no evidence of dilitation or obstruction. Venous: The inferior vena cava is dilated in size with less than 50% respiratory variability, suggesting right atrial pressure of 15 mmHg. IAS/Shunts: The atrial septum is grossly normal.  LEFT VENTRICLE PLAX 2D LVIDd:         4.30 cm  Diastology LVIDs:         2.60 cm  LV e' medial:    5.44 cm/s LV PW:         1.80 cm  LV E/e' medial:  11.7 LV IVS:        1.90 cm  LV e' lateral:   5.66 cm/s LVOT diam:     2.00 cm  LV E/e' lateral: 11.3 LV SV:         63 LV SV Index:   27 LVOT Area:     3.14 cm  RIGHT VENTRICLE             IVC RV Basal diam:  3.30 cm     IVC diam: 2.20 cm RV S prime:     17.40 cm/s TAPSE (M-mode): 2.3 cm LEFT ATRIUM             Index       RIGHT ATRIUM           Index LA diam:        4.10 cm 1.75 cm/m  RA Area:     18.10 cm LA Vol (A2C):   78.9 ml 33.64 ml/m RA Volume:   46.60 ml  19.87 ml/m LA Vol (A4C):   87.1 ml 37.14 ml/m LA Biplane Vol: 84.5 ml 36.03 ml/m  AORTIC VALVE AV Area (Vmax):    2.76 cm AV Area (Vmean):   2.57 cm AV Area (VTI):     2.64 cm AV Vmax:           156.00 cm/s AV Vmean:          105.000 cm/s AV VTI:            0.237 m AV Peak Grad:      9.7 mmHg AV Mean Grad:      5.0 mmHg LVOT Vmax:         137.00 cm/s LVOT Vmean:        86.000 cm/s LVOT VTI:          0.199 m LVOT/AV VTI ratio: 0.84  AORTA Ao Root diam: 3.60 cm Ao Asc diam:  3.50 cm MITRAL VALVE MV Area (PHT): 3.77 cm    SHUNTS MV Decel Time: 201 msec    Systemic VTI:  0.20 m MV E  velocity: 63.70 cm/s  Systemic Diam: 2.00 cm MV A velocity: 52.60 cm/s MV E/A ratio:  1.21 Jodelle Red MD Electronically signed by Jodelle Red MD Signature Date/Time: 05/08/2020/11:51:22 AM    Final     Cardiac Studies  TTE 05/08/2020 1. Left ventricular ejection fraction, by estimation, is 70 to 75%. The  left ventricle has hyperdynamic function. The left ventricle has no  regional wall motion abnormalities. There is severe concentric left  ventricular hypertrophy.  Left ventricular  diastolic parameters are indeterminate.  2. Right ventricular systolic function is normal. The right ventricular  size is normal.  3. Left atrial size was moderately dilated.  4. The mitral valve is normal in structure. Trivial mitral valve  regurgitation. No evidence of mitral stenosis.  5. The aortic valve is tricuspid. Aortic valve regurgitation is not  visualized. No aortic stenosis is present.  6. The inferior vena cava is dilated in size with <50% respiratory  variability, suggesting right atrial pressure of 15 mmHg.   CCTA 05/08/2020  IMPRESSION: 1. Coronary calcium score of 0.  2. Normal coronary origin with right dominance.  3. Normal coronary arteries.  4. Mid LAD myocardial bridge (normal variant).  5. 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  RECOMMENDATIONS: 1. No evidence of CAD (0%). Consider non-atherosclerotic causes of chest pain.  2. Cardiac MRI is recommended to exclude myocarditis and for further evaluation of apical hypertrophic cardiomyopathy.  CMR 05/08/2020   IMPRESSION: 1. Findings consistent with acute myocarditis, including elevated native T1/T2/ECV and midwall late gadolinium enhancement in the mid anterior wall  2. Normal LV size, moderate concentric hypertrophy, and normal systolic function (EF 61%)  3.  Normal RV size and systolic function (EF  57%)   Patient Profile  Judieth KeensMarcus A Cochran is a 49 y.o. male with HTN, OSA admitted with CP. Found to have myocarditis.   Assessment & Plan   1. Chest pain/Myocarditis -admitted with atypical CP. Troponin minimally elevated. CCTA without CAD. CMR with myocarditis. -no concerns for apical variant HCM. Suspect this was artifact on CTA -started on colchicine 0.6 mg BID and should be discharged on this. Will do 3-6 months of therapy  -Discussed that he can not do any heavy physical activity. He can only do light walking and things around the house. No gym activity, no heavy lifting over 20-30 lbs. 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  -continue home BP meds -suspect this is related to Pfizer vaccine as symptoms started 1-2 weeks after 2nd dose.  -tylenol ok for pain. Advised to avoid NSAIDS  [facility] will sign off.   Medication Recommendations:  Colchicine 0.6 mg BID, tylenol as needed for pain Other recommendations (labs, testing, etc):  Physical restrictions as above Follow up as an outpatient:  He will see me in office in 2-3 weeks   For questions or updates, please contact [facility] Please consult [email or web address] for contact info under   Time Spent with Patient: I have spent a total of 25 minutes with patient reviewing hospital notes, telemetry, EKGs, labs and examining the patient as well as establishing an assessment and plan that was discussed with the patient.  > 50% of time was spent in direct patient care.    Signed, Lenna GilfordWesley T. Flora Lipps'Neal, MD [facility]  [facility]  05/09/2020 8:51 AM

## 2020-05-10 NOTE — Discharge Summary (Signed)
Physician Discharge Summary  Leroy Cochran WGN:[id number] DOB: [birth year] DOA: 05/07/2020  PCP: Mila [place], MD  Admit date: 05/07/2020 Discharge date: 05/10/2020  Recommendations for Outpatient Follow-up:  1. Discharge to home 2. Follow up with PCP in 7-10 days. Chemistry to be drawn on this visit. Report results to PCP. 3. Follow up with Dr. Flora Lipps in 2-3 weeks 4. No heavy physical activity. Light housework only. 5. No lifting more than 20-30 lbs.  Discharge Diagnoses: Principal diagnosis is #1 1. 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 Hypertension 3. Mild hypokalemia  Discharge Condition: Fair  Disposition: Home  Diet recommendation: Heart healthy  Filed Weights   05/08/20 0802 05/08/20 1842  Weight: 115.7 kg 113.1 kg    History of present illness: Leroy Cochran is a 49 y.o. male with history of hypertension presents to the ER with complaints of chest pain.  Patient has been having chest pain off and on for last 3 weeks.  Chest pain is associated with shortness of breath and happens on exertion.  Pain is mostly around the anterior chest wall stabbing in nature with no productive cough fever or chills.  Had received Covid vaccination second dose from Pfizer vaccine about 3 weeks ago.  Patient had followed up with his primary care physician and was referred to the ER.  In 2018 patient had stress test which was unremarkable.  ED Course: In the ER patient still had mild chest pain which was resolved after nitroglycerin.  Cardiac markers I sensitive troponin was 1635 EKG shows changes which are compatible with the old EKG in 2018.  Cardiology has been consulted patient admitted for further management.  Labs also significant for mild hypokalemia.  D-dimer was negative.  Covid test was negative.  Hospital Course:  [facility] have been consulted to admit the patient for further evaluation and care. Cardiology was consulted. The patient  has an abnormal EKG at baseline.  Echocardiogram demonstrated normal LV function and no wall motion abnormalities. Cardiology has been consulted. They plan to do a Coronary CTA, and to evaluate the patient for paricarditis or possible hypertrophic cardiomyopathy as may be suggested by his EKG. Cardiac MRI was positive for myocarditis. The patient was started on colchicine 0.6 mg bid. He will need to be on this for 2-3 months. Cardiology has cleared the patient for discharge to home. He may return to work on light duty only for at least 2 months.  Today's assessment: S: The patient is resting comfortably. No new complaints. O: Vitals:  Vitals:   05/09/20 0457 05/09/20 1230  BP: 130/80 (!) 137/91  Pulse: 77 88  Resp: 18 16  Temp: 98.7 F (37.1 C) 98.3 F (36.8 C)  SpO2: 98% 91%   Exam:  Constitutional:  . The patient is awake, alert, and oriented x 3. No acute distress. Respiratory:  . No increased work of breathing. . No wheezes, rales, or rhonchi . No tactile fremitus Cardiovascular:  . Regular rate and rhythm . No murmurs, ectopy, or gallups. . No lateral PMI. No thrills. Abdomen:  . Abdomen is soft, non-tender, non-distended . No hernias, masses, or organomegaly . Normoactive bowel sounds.  Musculoskeletal:  . No cyanosis, clubbing, or edema Skin:  . No rashes, lesions, ulcers . palpation of skin: no induration or nodules Neurologic:  . CN 2-12 intact . Sensation all 4 extremities intact Psychiatric:  . Mental status o Mood, affect appropriate o Orientation to person, place, time  . judgment and insight appear intact  Discharge Instructions  Discharge Instructions    Call MD for:  difficulty breathing, headache or visual disturbances   Complete by: As directed    Call MD for:  severe uncontrolled pain   Complete by: As directed    Diet - low sodium heart healthy   Complete by: As directed    Discharge instructions   Complete by: As directed    Discharge to home Follow up with PCP in 7-10  days. Chemistry to be drawn on this visit. Report results to PCP. Follow up with Dr. Flora Lipps in 2-3 weeks No heavy physical activity. Light housework only. No lifting more than 20-30 lbs.   Increase activity slowly   Complete by: As directed      Allergies as of 05/09/2020   No Known Allergies     Medication List    TAKE these medications   amLODipine 5 MG tablet Commonly known as: NORVASC Take 1 tablet (5 mg total) by mouth daily.   cetirizine 10 MG tablet Commonly known as: ZYRTEC Take 10 mg by mouth daily as needed for allergies.   colchicine 0.6 MG tablet Take 1 tablet (0.6 mg total) by mouth 2 (two) times daily.   fluticasone 50 MCG/ACT nasal spray Commonly known as: FLONASE Place 1 spray into both nostrils daily as needed for allergies.   hydrochlorothiazide 12.5 MG capsule Commonly known as: MICROZIDE Take 1 capsule (12.5 mg total) by mouth daily.   irbesartan 300 MG tablet Commonly known as: AVAPRO Take 1 tablet (300 mg total) by mouth daily.   pantoprazole 40 MG tablet Commonly known as: PROTONIX Take 1 tablet (40 mg total) by mouth daily.   Symbicort 80-4.5 MCG/ACT inhaler Generic drug: budesonide-formoterol Inhale 1 puff into the lungs daily.   Tribenzor 40-5-25 MG Tabs Generic drug: Olmesartan-amLODIPine-HCTZ Take 1 tablet by mouth daily. What changed: Another medication with the same name was removed. Continue taking this medication, and follow the directions you see here.      No Known Allergies  The results of significant diagnostics from this hospitalization (including imaging, microbiology, ancillary and laboratory) are listed below for reference.    Significant Diagnostic Studies: DG Chest 2 View  Result Date: 05/07/2020 CLINICAL DATA:  Chest pain EXAM: CHEST - 2 VIEW COMPARISON:  06/24/2015 FINDINGS: Heart and mediastinal contours are within normal limits. No focal opacities or effusions. No acute bony abnormality. IMPRESSION: Normal  study. Electronically Signed   By: Charlett Nose M.D.   On: 05/07/2020 23:35   CT CORONARY MORPH W/CTA COR W/SCORE W/CA W/CM &/OR WO/CM  Addendum Date: 05/08/2020   ADDENDUM REPORT: 05/08/2020 11:30 CLINICAL DATA:  Chest pain EXAM: Cardiac/Coronary CTA TECHNIQUE: The patient was scanned on a [employer]. A 100 kV prospective scan was triggered in the descending thoracic aorta at 111 HU's. Axial non-contrast 3 mm slices were carried out through the heart. The data set was analyzed on a dedicated work station and scored using the Agatson method. Gantry rotation speed was 250 msecs and collimation was .6 mm. No beta blockade and 0.8 mg of sl NTG was given. The 3D data set was reconstructed in 5% intervals of the 35-75 % of the R-R cycle. Diastolic phases were analyzed on a dedicated work station using MPR, MIP and VRT modes. The patient received 80 cc of contrast. FINDINGS: Image quality: excellent. Noise artifact is: Limited. Coronary Arteries:  Normal coronary origin.  Right dominance. Left main: The left main is a large caliber vessel with a  normal take off from the left coronary cusp that trifurcates into a LAD, LCX, and ramus intermedius. There is no plaque or stenosis. Left anterior descending artery: The LAD is patent without evidence of plaque or stenosis. 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 The LAD gives off 2 patent diagonal branches. The LAD is a large wrap around vessel and supplies the distal 1/3 of the posterior left ventricle. Ramus intermedius: Patent with no evidence of plaque or stenosis. Left circumflex artery: The LCX is non-dominant and patent with no evidence of plaque or stenosis. The LCX gives off 2 patent obtuse marginal branches and a patent PLV branch. Right coronary artery: The RCA is dominant with normal take off from the right coronary cusp. There is no evidence of plaque or stenosis. The RCA terminates as a PDA without evidence of plaque or stenosis. Right Atrium: Right  atrial size is within normal limits. Right Ventricle: The right ventricular cavity is within normal limits. Left Atrium: Left atrial size is normal in size with no left atrial appendage filling defect. Left Ventricle: The ventricular cavity size is within normal limits. There are no stigmata of prior infarction. There is no abnormal filling defect. 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 No apical aneurysm seen. Pulmonary arteries: Normal in size without proximal filling defect. Pulmonary veins: Normal pulmonary venous drainage. Pericardium: Normal thickness with no significant effusion or calcium present. Cardiac valves: The aortic valve is trileaflet without significant calcification. The mitral valve is normal structure without significant calcification. Aorta: Normal caliber with no significant disease. Extra-cardiac findings: See attached radiology report for non-cardiac structures. IMPRESSION: 1. Coronary calcium score of 0. 2. Normal coronary origin with right dominance. 3. Normal coronary arteries. 4. Mid LAD myocardial bridge (normal variant). 5. 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 RECOMMENDATIONS: 1. No evidence of CAD (0%). Consider non-atherosclerotic causes of chest pain. 2. Cardiac MRI is recommended to exclude myocarditis and for further evaluation of apical hypertrophic cardiomyopathy. Lennie Odor, MD Electronically Signed   By: Lennie Odor   On: 05/08/2020 11:30   Result Date: 05/08/2020 EXAM: OVER-READ INTERPRETATION  CT CHEST The following report is an over-read performed by radiologist Dr. Trudie Reed of [facility] on 05/08/2020. This over-read does not include interpretation of cardiac or coronary anatomy or pathology. The coronary calcium score/coronary CTA interpretation by the  cardiologist is attached. COMPARISON:  None. FINDINGS: Within the visualized portions of the thorax there are no suspicious appearing pulmonary nodules or masses, there is no acute consolidative airspace disease, no pleural effusions, no pneumothorax and no lymphadenopathy. Visualized portions of the upper abdomen are unremarkable. There are no aggressive appearing lytic or blastic lesions noted in the visualized portions of the skeleton. IMPRESSION: No significant incidental noncardiac findings are noted. Electronically Signed: By: Trudie Reed M.D. On: 05/08/2020 10:36   MR CARDIAC MORPHOLOGY W WO CONTRAST  Result Date: 05/08/2020 CLINICAL DATA:  Troponin elevation EXAM: CARDIAC MRI TECHNIQUE: The patient was scanned on a 1.5 Tesla Siemens magnet. A dedicated cardiac coil was used. Functional imaging was done using [employer] sequences. 2,3, and 4 chamber views were done to assess for RWMA's. Modified Simpson's rule using a short axis stack was used to calculate an ejection fraction on a dedicated work [employer]. The patient received 10 cc of Gadavist. After 10 minutes inversion recovery  sequences were used to assess for infiltration and scar tissue. CONTRAST:  10 cc  of Gadavist FINDINGS: Left ventricle: -Normal size -Moderate concentric hypertrophy -Normal systolic function -Elevated native T1 (1148 ms), T2 (60 ms) and ECV (32%) in mid anterior wall -Midwall LGE in mid anterior wall LV EF: 61% (Normal 56-78%) Absolute volumes: LV EDV: (Normal 77-195 mL) LV ESV: 74mL (Normal 19-72 mL) LV SV: (Normal 51-133 mL) CO: 9.1L/min (Normal 2.8-8.8 L/min) Indexed volumes: LV EDV: 6mL/sq-m (Normal 47-92 mL/sq-m) LV ESV: 59mL/sq-m (Normal 13-30 mL/sq-m) LV SV: 101mL/sq-m (Normal 32-62 mL/sq-m) CI: 3.8L/min/sq-m (Normal 1.7-4.2 L/min/sq-m) Right ventricle: Normal size and systolic function RV EF:  57% (Normal 47-74%) Absolute volumes: RV EDV: (Normal 88-227 mL) RV ESV: 88mL (Normal  23-103 mL) RV SV: (Normal 52-138 mL) CO: 9.4L/min (Normal 2.8-8.8 L/min) Indexed volumes: RV EDV: 50mL/sq-m (Normal 55-105 mL/sq-m) RV ESV: 30mL/sq-m (Normal 15-43 mL/sq-m) RV SV: 85mL/sq-m (Normal 32-64 mL/sq-m) CI: 3.9L/min/sq-m (Normal 1.7-4.2 L/min/sq-m) Left atrium: Mild enlargement Right atrium: Mild enlargement Mitral valve: Trivial regurgitation Aortic valve: Tricuspid.   Trivial regurgitation Tricuspid valve: Trivial regurgitation Pulmonic valve: No regurgitation Aorta: Normal proximal ascending aorta Pericardium: Normal IMPRESSION: 1. Findings consistent with acute myocarditis, including elevated native T1/T2/ECV and midwall late gadolinium enhancement in the mid anterior wall 2. Normal LV size, moderate concentric hypertrophy, and normal systolic function (EF 61%) 3.  Normal RV size and systolic function (EF 57%) Electronically Signed   By: Epifanio Lesches MD   On: 05/08/2020 19:42   ECHOCARDIOGRAM COMPLETE  Result Date: 05/08/2020    ECHOCARDIOGRAM REPORT   Patient Name:   Leroy Cochran Date of Exam: 05/08/2020 Medical Rec #:  [id number]       Height:       71.0 in Accession #:    [id number]      Weight:       256.9 lb Date of Birth:  1970-07-07       BSA:          2.345 m Patient Age:    49 years        BP:           135/65 mmHg Patient Gender: M               HR:           59 bpm. Exam Location:  Inpatient Procedure: 2D Echo, Cardiac Doppler and Color Doppler Indications:    Chest pain  History:        Patient has prior history of Echocardiogram examinations, most                 recent 03/20/2017. Risk Factors:Sleep Apnea, Dyslipidemia and                 Hypertension.  Sonographer:    Ross Ludwig RDCS (AE) Referring Phys: [id number] Ronnald Ramp O'NEAL IMPRESSIONS  1. Left ventricular ejection fraction, by estimation, is 70 to 75%. The left ventricle has hyperdynamic function. The left ventricle has no regional wall motion abnormalities. There is severe concentric left ventricular  hypertrophy. Left ventricular diastolic parameters are indeterminate.  2. Right ventricular systolic function is normal. The right ventricular size is normal.  3. Left atrial size was moderately dilated.  4. The mitral valve is normal in structure. Trivial mitral valve regurgitation. No evidence of mitral stenosis.  5. The aortic valve is tricuspid. Aortic valve regurgitation is not visualized. No aortic stenosis is present.  6. The inferior  vena cava is dilated in size with <50% respiratory variability, suggesting right atrial pressure of 15 mmHg. Comparison(s): No significant change from prior study. Prior images reviewed side by side. Appears to have some LVH on prior study (not commented on) when compared side by side. Conclusion(s)/Recommendation(s): Normal biventricular function without evidence of hemodynamically significant valvular heart disease. FINDINGS  Left Ventricle: Left ventricular ejection fraction, by estimation, is 70 to 75%. The left ventricle has hyperdynamic function. The left ventricle has no regional wall motion abnormalities. The left ventricular internal cavity size was normal in size. There is severe concentric left ventricular hypertrophy. Left ventricular diastolic parameters are indeterminate. Right Ventricle: The right ventricular size is normal. No increase in right ventricular wall thickness. Right ventricular systolic function is normal. Left Atrium: Left atrial size was moderately dilated. Right Atrium: Right atrial size was normal in size. Pericardium: There is no evidence of pericardial effusion. Mitral Valve: The mitral valve is normal in structure. Trivial mitral valve regurgitation. No evidence of mitral valve stenosis. Tricuspid Valve: The tricuspid valve is normal in structure. Tricuspid valve regurgitation is trivial. No evidence of tricuspid stenosis. Aortic Valve: The aortic valve is tricuspid. Aortic valve regurgitation is not visualized. No aortic stenosis is present.  Aortic valve mean gradient measures 5.0 mmHg. Aortic valve peak gradient measures 9.7 mmHg. Aortic valve area, by VTI measures 2.64 cm. Pulmonic Valve: The pulmonic valve was grossly normal. Pulmonic valve regurgitation is not visualized. Aorta: The aortic root, ascending aorta and aortic arch are all structurally normal, with no evidence of dilitation or obstruction. Venous: The inferior vena cava is dilated in size with less than 50% respiratory variability, suggesting right atrial pressure of 15 mmHg. IAS/Shunts: The atrial septum is grossly normal.  LEFT VENTRICLE PLAX 2D LVIDd:         4.30 cm  Diastology LVIDs:         2.60 cm  LV e' medial:    5.44 cm/s LV PW:         1.80 cm  LV E/e' medial:  11.7 LV IVS:        1.90 cm  LV e' lateral:   5.66 cm/s LVOT diam:     2.00 cm  LV E/e' lateral: 11.3 LV SV:         63 LV SV Index:   27 LVOT Area:     3.14 cm  RIGHT VENTRICLE             IVC RV Basal diam:  3.30 cm     IVC diam: 2.20 cm RV S prime:     17.40 cm/s TAPSE (M-mode): 2.3 cm LEFT ATRIUM             Index       RIGHT ATRIUM           Index LA diam:        4.10 cm 1.75 cm/m  RA Area:     18.10 cm LA Vol (A2C):   78.9 ml 33.64 ml/m RA Volume:   46.60 ml  19.87 ml/m LA Vol (A4C):   87.1 ml 37.14 ml/m LA Biplane Vol: 84.5 ml 36.03 ml/m  AORTIC VALVE AV Area (Vmax):    2.76 cm AV Area (Vmean):   2.57 cm AV Area (VTI):     2.64 cm AV Vmax:           156.00 cm/s AV Vmean:          105.000 cm/s AV VTI:  0.237 m AV Peak Grad:      9.7 mmHg AV Mean Grad:      5.0 mmHg LVOT Vmax:         137.00 cm/s LVOT Vmean:        86.000 cm/s LVOT VTI:          0.199 m LVOT/AV VTI ratio: 0.84  AORTA Ao Root diam: 3.60 cm Ao Asc diam:  3.50 cm MITRAL VALVE MV Area (PHT): 3.77 cm    SHUNTS MV Decel Time: 201 msec    Systemic VTI:  0.20 m MV E velocity: 63.70 cm/s  Systemic Diam: 2.00 cm MV A velocity: 52.60 cm/s MV E/A ratio:  1.21 Jodelle Red MD Electronically signed by Jodelle Red MD  Signature Date/Time: 05/08/2020/11:51:22 AM    Final     Microbiology: Recent Results (from the past 240 hour(s))  Resp Panel by RT-PCR (Flu A&B, Covid) Nasopharyngeal Swab     Status: None   Collection Time: 05/08/20 12:51 AM   Specimen: Nasopharyngeal Swab; Nasopharyngeal(NP) swabs in vial transport medium  Result Value Ref Range Status   SARS Coronavirus 2 by RT PCR NEGATIVE NEGATIVE Final    Comment: (NOTE) SARS-CoV-2 target nucleic acids are NOT DETECTED.  The SARS-CoV-2 RNA is generally detectable in upper respiratory specimens during the acute phase of infection. The lowest concentration of SARS-CoV-2 viral copies this assay can detect is 138 copies/mL. A negative result does not preclude SARS-Cov-2 infection and should not be used as the sole basis for treatment or other patient management decisions. A negative result may occur with  improper specimen collection/handling, submission of specimen other than nasopharyngeal swab, presence of viral mutation(s) within the areas targeted by this assay, and inadequate number of viral copies(<138 copies/mL). A negative result must be combined with clinical observations, patient history, and epidemiological information. The expected result is Negative.  Fact Sheet for Patients:  BloggerCourse.com  Fact Sheet for Healthcare Providers:  SeriousBroker.it  This test is no t yet approved or cleared by the [place] FDA and  has been authorized for detection and/or diagnosis of SARS-CoV-2 by FDA under an Emergency Use Authorization (EUA). This EUA will remain  in effect (meaning this test can be used) for the duration of the COVID-19 declaration under Section 564(b)(1) of the Act, 21 U.S.C.section 360bbb-3(b)(1), unless the authorization is terminated  or revoked sooner.       Influenza A by PCR NEGATIVE NEGATIVE Final   Influenza B by PCR NEGATIVE NEGATIVE Final    Comment:  (NOTE) The Xpert Xpress SARS-CoV-2/FLU/RSV plus assay is intended as an aid in the diagnosis of influenza from Nasopharyngeal swab specimens and should not be used as a sole basis for treatment. Nasal washings and aspirates are unacceptable for Xpert Xpress SARS-CoV-2/FLU/RSV testing.  Fact Sheet for Patients: BloggerCourse.com  Fact Sheet for Healthcare Providers: SeriousBroker.it  This test is not yet approved or cleared by the [place] FDA and has been authorized for detection and/or diagnosis of SARS-CoV-2 by FDA under an Emergency Use Authorization (EUA). This EUA will remain in effect (meaning this test can be used) for the duration of the COVID-19 declaration under Section 564(b)(1) of the Act, 21 U.S.C. section 360bbb-3(b)(1), unless the authorization is terminated or revoked.  Performed at [facility], [address] [place], [place] [address]      Labs: Basic Metabolic Panel: Recent Labs  Lab 05/07/20 2114 05/08/20 0624 05/09/20 0436  NA 140  --  139  K 3.4*  --  3.6  CL 102  --  103  CO2 27  --  27  GLUCOSE 103*  --  102*  BUN 13  --  14  CREATININE 1.26* 1.13 1.25*  CALCIUM 9.4  --  8.6*   Liver Function Tests: No results for input(s): AST, ALT, ALKPHOS, BILITOT, PROT, ALBUMIN in the last 168 hours. No results for input(s): LIPASE, AMYLASE in the last 168 hours. No results for input(s): AMMONIA in the last 168 hours. CBC: Recent Labs  Lab 05/07/20 2114 05/08/20 0624  WBC 7.7 7.8  HGB 14.3 12.8*  HCT 46.5 42.6  MCV 83.0 83.7  PLT 333 288   Cardiac Enzymes: No results for input(s): CKTOTAL, CKMB, CKMBINDEX, TROPONINI in the last 168 hours. BNP: BNP (last 3 results) Recent Labs    05/08/20 0855  BNP 44.7    ProBNP (last 3 results) No results for input(s): PROBNP in the last 8760 hours.  CBG: No results for input(s): GLUCAP in the last 168 hours.  Principal Problem:   Chest  pain Active Problems:   Essential hypertension   Time coordinating discharge: 38 minutes.  Signed:        Rachel Samples, DO [facility]  05/10/2020, 5:09 PM

## 2020-05-11 ENCOUNTER — Telehealth: Payer: Self-pay | Admitting: Cardiovascular Disease

## 2020-05-11 NOTE — Telephone Encounter (Signed)
Spoke with patient who states that while hospitalized Dr. Flora Lipps mentioned writing the patient a letter to be out of work or on light duty for 3-6 months. Patient states that he needs a letter with date to be able to return to work on light duty. Advised patient I would forward message to Dr. Flora Lipps. Patient verbalized understanding.

## 2020-05-11 NOTE — Telephone Encounter (Signed)
He has myocarditis. He must remain on desk duty for 3 months. He can only return to full duty at my discretion. Can you write him a letter?  Gerri Spore T. Flora Lipps, MD [facility]  [address], [address] [place], [place] [address] [phone]  2:00 PM

## 2020-05-11 NOTE — Telephone Encounter (Signed)
Patient is calling to speak to Dr. Richrd Sox nurse about a letter for work that the Doctor told him he would give him. Patient states that he doesn't know if he should return back to work with his new diagnoses and also doesn't know if he should go on light duty or not. Please advise by email [email or web address]  or work cell [phone] per patients request.

## 2020-05-12 NOTE — Telephone Encounter (Signed)
Letter written sent via mychart

## 2020-05-27 NOTE — Progress Notes (Unsigned)
Cardiology Office Note:   Date:  05/28/2020  NAME:  Leroy Cochran    MRN: [id number] DOB:  16-Jun-1970   PCP:  Mila Palmer, MD  Cardiologist:  No primary care provider on file.  Referring MD: Mila Palmer, MD   Chief Complaint  Patient presents with  . myocarditis   History of Present Illness:   Leroy Cochran is a 50 y.o. male with a hx of HTN, obesity, sleep apnea, myocarditis who presents for follow-up. Evaluated 05/08/2020 for CP and minimally elevated troponin. CCTA with normal coronaries. CMR with myocarditis. He was seen in the hospital for myocarditis.  He did receive the second Pfizer vaccine on 03/25/2020.  He reports within 3 weeks he developed symptoms of chest pain and shortness of breath.  He was then evaluated in the hospital on 05/07/2020 as symptoms did not improve.  He was found to have elevated troponins.  Negative coronary CTA for coronary artery disease.  CMR with myocarditis in the anterior wall.  This was presumed Pfizer vaccine related.  He reports he is doing well since leaving the hospital.  He is still having chest pressure.  Symptoms are occurring 3 to 4 days/week.  They can occur with exertion or in the morning time.  He reports when waking he can feel pressure in his chest.  Also activity can get short of breath.  He also endorses palpitations and heart racing.  Overall symptoms are improving on colchicine.  He has had no syncopal events.  He is still having symptoms but they are improving.  He does not smoke.  He has sleep apnea and uses his machine.  Blood pressure is 128/84.  We did discuss that he cannot do any heavy competitive activity.  No running or any heavy weightlifting.  Light walking is okay.  I do not want him to do anything heavy to possibly induce an arrhythmia.  His ejection fraction is normal.  We will have better guidance after he completes his heart monitor.  Problem List 1. Myocarditis  -05/08/2020 -CMR positive -EF 75% -CCTA with normal  coronaries, calcium score 0 2. HTN 3. LVH, moderate -moderate on CMR 4. OSA -wears machine  5. Obesity  Past Medical History: Past Medical History:  Diagnosis Date  . Essential hypertension   . Hyperlipidemia   . Insomnia   . OSA on CPAP    Dr.Turner;; CPAP -1, (RDI=82.7, Low O2 - 78%)    Past Surgical History: Past Surgical History:  Procedure Laterality Date  . CPX - CARDIOPULMONARY EXERCISE TEST  04/17/2017    Limited due to submaximal exercise (did not reach target heart rate).  Response.  Some shortness of breath related to abnormal relaxation.  No cardiopulmonary limitations.  Limitations more related to deconditioning and body habitus.  . WISDOM TOOTH EXTRACTION      Current Medications: Current Meds  Medication Sig  . cetirizine (ZYRTEC) 10 MG tablet Take 10 mg by mouth daily as needed for allergies.  . fluticasone (FLONASE) 50 MCG/ACT nasal spray Place 1 spray into both nostrils daily as needed for allergies.  . pantoprazole (PROTONIX) 40 MG tablet Take 1 tablet (40 mg total) by mouth daily.  . SYMBICORT 80-4.5 MCG/ACT inhaler Inhale 1 puff into the lungs daily.  Marya Landry 40-5-25 MG TABS Take 1 tablet by mouth daily.  . [DISCONTINUED] amLODipine (NORVASC) 5 MG tablet Take 1 tablet (5 mg total) by mouth daily.  . [DISCONTINUED] colchicine 0.6 MG tablet Take 1 tablet (0.6 mg  total) by mouth 2 (two) times daily.  . [DISCONTINUED] hydrochlorothiazide (MICROZIDE) 12.5 MG capsule Take 1 capsule (12.5 mg total) by mouth daily.  . [DISCONTINUED] irbesartan (AVAPRO) 300 MG tablet Take 1 tablet (300 mg total) by mouth daily.     Allergies:    Patient has no known allergies.   Social History: Social History   Socioeconomic History  . Marital status: Married    Spouse name: Not on file  . Number of children: 1  . Years of education: Not on file  . Highest education level: High school graduate  Occupational History  . Not on file  Tobacco Use  . Smoking status:  Never Smoker  . Smokeless tobacco: Never Used  Substance and Sexual Activity  . Alcohol use: Yes    Comment: occasional wine  . Drug use: No  . Sexual activity: Not on file  Other Topics Concern  . Not on file  Social History Narrative   He is a divorced father of 1 daughter.  He lives with his daughter.   Social Determinants of Health   Financial Resource Strain: Not on file  Food Insecurity: Not on file  Transportation Needs: Not on file  Physical Activity: Not on file  Stress: Not on file  Social Connections: Not on file     Family History: The patient's family history includes Cervical cancer in his mother; Healthy in his daughter, father, and sister; Heart attack in his maternal grandfather.  ROS:   All other ROS reviewed and negative. Pertinent positives noted in the HPI.     EKGs/Labs/Other Studies Reviewed:   The following studies were personally reviewed by me today:  TTE 05/08/2020 1. Left ventricular ejection fraction, by estimation, is 70 to 75%. The  left ventricle has hyperdynamic function. The left ventricle has no  regional wall motion abnormalities. There is severe concentric left  ventricular hypertrophy. Left ventricular  diastolic parameters are indeterminate.  2. Right ventricular systolic function is normal. The right ventricular  size is normal.  3. Left atrial size was moderately dilated.  4. The mitral valve is normal in structure. Trivial mitral valve  regurgitation. No evidence of mitral stenosis.  5. The aortic valve is tricuspid. Aortic valve regurgitation is not  visualized. No aortic stenosis is present.  6. The inferior vena cava is dilated in size with <50% respiratory  variability, suggesting right atrial pressure of 15 mmHg.   CCTA 05/08/2020 IMPRESSION: 1. Coronary calcium score of 0.  2. Normal coronary origin with right dominance.  3. Normal coronary arteries.  4. Mid LAD myocardial bridge (normal variant).  5.  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  RECOMMENDATIONS: 1. No evidence of CAD (0%). Consider non-atherosclerotic causes of chest pain.  2. Cardiac MRI is recommended to exclude myocarditis and for further evaluation of apical hypertrophic cardiomyopathy.  CMR 05/08/2020 IMPRESSION: 1. Findings consistent with acute myocarditis, including elevated native T1/T2/ECV and midwall late gadolinium enhancement in the mid anterior wall  2. Normal LV size, moderate concentric hypertrophy, and normal systolic function (EF 40%)  3.  Normal RV size and systolic function (EF 98%) Recent Labs: 05/08/2020: B Natriuretic Peptide 44.7; Hemoglobin 12.8; Platelets 288 05/09/2020: BUN 14; Creatinine, Ser 1.25; Potassium 3.6; Sodium 139   Recent Lipid Panel No results found for: CHOL, TRIG, HDL, CHOLHDL, VLDL, LDLCALC, LDLDIRECT  Physical Exam:   VS:  BP 128/84 (BP Location: Left Arm, Patient Position: Sitting, Cuff  Size: Large)   Pulse 77   Ht 6' (1.829 m)   Wt 255 lb (115.7 kg)   BMI 34.58 kg/m    Wt Readings from Last 3 Encounters:  05/28/20 255 lb (115.7 kg)  05/08/20 249 lb 5.4 oz (113.1 kg)  11/06/19 256 lb 14.4 oz (116.5 kg)    General: Well nourished, well developed, in no acute distress Head: Atraumatic, normal size  Eyes: PEERLA, EOMI  Neck: Supple, no JVD Endocrine: No thryomegaly Cardiac: Normal S1, S2; RRR; no murmurs, rubs, or gallops Lungs: Clear to auscultation bilaterally, no wheezing, rhonchi or rales  Abd: Soft, nontender, no hepatomegaly  Ext: No edema, pulses 2+ Musculoskeletal: No deformities, BUE and BLE strength normal and equal Skin: Warm and dry, no rashes   Neuro: Alert and oriented to person, place, time, and situation, CNII-XII grossly intact, no focal deficits  Psych: Normal mood and affect   ASSESSMENT:   ISABELLA IDA is a 50 y.o. male who presents for the following: 1. Acute  idiopathic myocarditis   2. Palpitations   3. Essential hypertension   4. Obesity (BMI 30-39.9)     PLAN:   1. Acute idiopathic myocarditis 2. Palpitations -He was seen in the hospital on 05/08/2020.  Had elevated troponins.  Normal coronary CTA.  Had symptoms of chest pain.  CMR confirmed myocarditis.  He received the second Pfizer vaccine on 03/25/2020.  Within 21 days he developed symptoms of chest pain and ultimately presented to the hospital on 05/07/2020. -He is in the right timeframe for Pfizer vaccine related myocarditis.  I have no other etiology for this.  The characteristic late gadolinium enhancement was also an abnormal spot, pointing to vaccine related myocarditis. -Echo with normal LVEF. -He had no ventricular arrhythmias in the hospital.  He is having palpitations.  We will proceed with a 7-day Zio patch. -Symptoms are improving on colchicine 0.6 twice daily.  We will continue this. 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  He cannot do any strenuous exercise or heavy lifting.  Light exercise is okay as long as symptoms are within reason.  He was given a work note that he cannot do heavy activity.  We will limit him for 3 to 6 months.  We will then plan to likely repeat an MRI or monitor to make sure inflammation is gone.  He was in agreement with this. -He cannot receive the booster shot.  I did go over this in the office with him today.  He is in agreement.  3. Essential hypertension -Well-controlled on current medications.  4. Obesity (BMI 30-39.9) -Weight loss and exercise recommended.  Disposition: Return in about 2 months (around 07/26/2020).  Medication Adjustments/Labs and Tests Ordered: Current medicines are reviewed at length with the patient today.  Concerns regarding medicines are outlined above.  Orders Placed This Encounter  Procedures  . LONG TERM MONITOR (3-14 DAYS)   Meds ordered this encounter  Medications  . colchicine 0.6 MG tablet     Sig: Take 1 tablet (0.6 mg total) by mouth 2 (two) times daily.    Dispense:  60 tablet    Refill:  1    Patient Instructions  Medication Instructions:  Your physician recommends that you continue on your current medications as directed. Please refer to the Current Medication list given to you today.  *If you need a refill on your cardiac medications before your next appointment, please call your pharmacy*  Lab Work: NONE ordered  at this time of appointment   If you have labs (blood work) drawn today and your tests are completely normal, you will receive your results only by: Marland Kitchen MyChart Message (if you have MyChart) OR . A paper copy in the mail If you have any lab test that is abnormal or we need to change your treatment, we will call you to review the results.  Testing/Procedures: Christena Deem- Long Term Monitor Instructions   Your physician has requested you wear your ZIO patch monitor 7 days.   This is a single patch monitor.  [employer] supplies one patch monitor per enrollment.  Additional stickers are not available.   Please do not apply patch if you will be having a Nuclear Stress Test, Echocardiogram, Cardiac CT, MRI, or Chest Xray during the time frame you would be wearing the monitor. The patch cannot be worn during these tests.  You cannot remove and re-apply the ZIO XT patch monitor.   Your ZIO patch monitor will be sent USPS Priority mail from [facility] directly to your home address. The monitor may also be mailed to a PO BOX if home delivery is not available.   It may take 3-5 days to receive your monitor after you have been enrolled.   Once you have received you monitor, please review enclosed instructions.  Your monitor has already been registered assigning a specific monitor serial # to you.   Applying the monitor   Shave hair from upper left chest.   Hold abrader disc by orange tab.  Rub abrader in 40 strokes over left upper chest as indicated in your monitor  instructions.   Clean area with 4 enclosed alcohol pads .  Use all pads to assure are is cleaned thoroughly.  Let dry.   Apply patch as indicated in monitor instructions.  Patch will be place under collarbone on left side of chest with arrow pointing upward.   Rub patch adhesive wings for 2 minutes.Remove white label marked "1".  Remove white label marked "2".  Rub patch adhesive wings for 2 additional minutes.   While looking in a mirror, press and release button in center of patch.  A small green light will flash 3-4 times .  This will be your only indicator the monitor has been turned on.     Do not shower for the first 24 hours.  You may shower after the first 24 hours.   Press button if you feel a symptom. You will hear a small click.  Record Date, Time and Symptom in the Patient Log Book.   When you are ready to remove patch, follow instructions on last 2 pages of Patient Log Book.  Stick patch monitor onto last page of Patient Log Book.   Place Patient Log Book in [place] box.  Use locking tab on box and tape box closed securely.  The Orange and [employer] has [employer] on it.  Please place in mailbox as soon as possible.  Your physician should have your test results approximately 7 days after the monitor has been mailed back to [facility].   Call [facility] Customer Care at [phone] if you have questions regarding your ZIO XT patch monitor.  Call them immediately if you see an orange light blinking on your monitor.   If your monitor falls off in less than 4 days contact our Monitor department at [phone].  If your monitor becomes loose or falls off after 4 days call [employer] at [phone] for suggestions  on securing your monitor.    Follow-Up: At [facility], you and your health needs are our priority.  As part of our continuing mission to provide you with exceptional heart care, we have created designated Provider Care Teams.  These Care Teams include your  primary Cardiologist (physician) and Advanced Practice Providers (APPs -  Physician Assistants and Nurse Practitioners) who all work together to provide you with the care you need, when you need it.  Your next appointment:   2 month(s)  The format for your next appointment:   In Person  Provider:   Lennie Odor, MD  Other Instructions      Time Spent with Patient: I have spent a total of 35 minutes with patient reviewing hospital notes, telemetry, EKGs, labs and examining the patient as well as establishing an assessment and plan that was discussed with the patient.  > 50% of time was spent in direct patient care.  Signed, Lenna Gilford. Flora Lipps, MD [facility]  [address] [place], [place] [address] [phone]  05/28/2020 11:29 AM

## 2020-05-28 ENCOUNTER — Other Ambulatory Visit: Payer: Self-pay

## 2020-05-28 ENCOUNTER — Ambulatory Visit: Payer: 59 | Admitting: Cardiovascular Disease

## 2020-05-28 ENCOUNTER — Ambulatory Visit (INDEPENDENT_AMBULATORY_CARE_PROVIDER_SITE_OTHER): Payer: 59

## 2020-05-28 ENCOUNTER — Encounter: Payer: Self-pay | Admitting: *Deleted

## 2020-05-28 ENCOUNTER — Encounter: Payer: Self-pay | Admitting: Cardiovascular Disease

## 2020-05-28 VITALS — BP 128/84 | HR 77 | Ht 72.0 in | Wt 255.0 lb

## 2020-05-28 DIAGNOSIS — R002 Palpitations: Secondary | ICD-10-CM

## 2020-05-28 DIAGNOSIS — I10 Essential (primary) hypertension: Secondary | ICD-10-CM

## 2020-05-28 DIAGNOSIS — E669 Obesity, unspecified: Secondary | ICD-10-CM | POA: Diagnosis not present

## 2020-05-28 DIAGNOSIS — I401 Isolated myocarditis: Secondary | ICD-10-CM

## 2020-05-28 MED ORDER — COLCHICINE 0.6 MG PO TABS: 0.6000 mg | ORAL_TABLET | Freq: Two times a day (BID) | ORAL | 1 refills | Status: DC

## 2020-05-28 NOTE — Progress Notes (Signed)
Patient ID: Leroy Cochran, male   DOB: 08-Dec-1970, 50 y.o.   MRN: [id number] Patient enrolled for [employer] to ship a 7 day ZIO XT long term holter monitor to his home.

## 2020-05-28 NOTE — Patient Instructions (Signed)
Medication Instructions:  Your physician recommends that you continue on your current medications as directed. Please refer to the Current Medication list given to you today.  *If you need a refill on your cardiac medications before your next appointment, please call your pharmacy*  Lab Work: NONE ordered at this time of appointment   If you have labs (blood work) drawn today and your tests are completely normal, you will receive your results only by: Marland Kitchen MyChart Message (if you have MyChart) OR . A paper copy in the mail If you have any lab test that is abnormal or we need to change your treatment, we will call you to review the results.  Testing/Procedures: Christena Deem- Long Term Monitor Instructions   Your physician has requested you wear your ZIO patch monitor 7 days.   This is a single patch monitor.  [employer] supplies one patch monitor per enrollment.  Additional stickers are not available.   Please do not apply patch if you will be having a Nuclear Stress Test, Echocardiogram, Cardiac CT, MRI, or Chest Xray during the time frame you would be wearing the monitor. The patch cannot be worn during these tests.  You cannot remove and re-apply the ZIO XT patch monitor.   Your ZIO patch monitor will be sent USPS Priority mail from [facility] directly to your home address. The monitor may also be mailed to a PO BOX if home delivery is not available.   It may take 3-5 days to receive your monitor after you have been enrolled.   Once you have received you monitor, please review enclosed instructions.  Your monitor has already been registered assigning a specific monitor serial # to you.   Applying the monitor   Shave hair from upper left chest.   Hold abrader disc by orange tab.  Rub abrader in 40 strokes over left upper chest as indicated in your monitor instructions.   Clean area with 4 enclosed alcohol pads .  Use all pads to assure are is cleaned thoroughly.  Let dry.   Apply  patch as indicated in monitor instructions.  Patch will be place under collarbone on left side of chest with arrow pointing upward.   Rub patch adhesive wings for 2 minutes.Remove white label marked "1".  Remove white label marked "2".  Rub patch adhesive wings for 2 additional minutes.   While looking in a mirror, press and release button in center of patch.  A small green light will flash 3-4 times .  This will be your only indicator the monitor has been turned on.     Do not shower for the first 24 hours.  You may shower after the first 24 hours.   Press button if you feel a symptom. You will hear a small click.  Record Date, Time and Symptom in the Patient Log Book.   When you are ready to remove patch, follow instructions on last 2 pages of Patient Log Book.  Stick patch monitor onto last page of Patient Log Book.   Place Patient Log Book in [place] box.  Use locking tab on box and tape box closed securely.  The [employer] and [employer] has [employer] on it.  Please place in mailbox as soon as possible.  Your physician should have your test results approximately 7 days after the monitor has been mailed back to [facility].   Call [facility] Customer Care at [phone] if you have questions regarding your ZIO XT patch monitor.  Call them immediately if you see an orange light blinking on your monitor.   If your monitor falls off in less than 4 days contact our Monitor department at [phone].  If your monitor becomes loose or falls off after 4 days call [employer] at [phone] for suggestions on securing your monitor.    Follow-Up: At [facility], you and your health needs are our priority.  As part of our continuing mission to provide you with exceptional heart care, we have created designated Provider Care Teams.  These Care Teams include your primary Cardiologist (physician) and Advanced Practice Providers (APPs -  Physician Assistants and Nurse Practitioners) who all work  together to provide you with the care you need, when you need it.  Your next appointment:   2 month(s)  The format for your next appointment:   In Person  Provider:   Lennie Odor, MD  Other Instructions

## 2020-06-03 DIAGNOSIS — R002 Palpitations: Secondary | ICD-10-CM | POA: Diagnosis not present

## 2020-06-16 ENCOUNTER — Other Ambulatory Visit: Payer: Self-pay

## 2020-06-16 DIAGNOSIS — U071 COVID-19: Secondary | ICD-10-CM

## 2020-06-16 NOTE — Telephone Encounter (Signed)
Please let him know he needs to be tested for Covid.  The vaccine will not prevent infection, it will lessen symptoms.  I would like for him to quarantine and to be tested for active Covid infection.  Gerri Spore T. Flora Lipps, MD, [facility]  [facility]  [address] [place], [place] [address] [phone]  10:22 AM

## 2020-06-24 ENCOUNTER — Telehealth (HOSPITAL_COMMUNITY): Payer: Self-pay

## 2020-06-24 ENCOUNTER — Other Ambulatory Visit: Payer: Self-pay | Admitting: Family

## 2020-06-24 NOTE — Progress Notes (Signed)
Entered in error

## 2020-06-24 NOTE — Telephone Encounter (Signed)
Pt called the COVID infusion clinic this AM stating he has an appointment today at 11am. Did not have patient on schedule, looked through chart and seems he is out of the window for treatment. Had NP, Gillian Shields also double check and she verified he is out of the window for treatment. RN called pt back to inform him he is not scheduled today and unfortunately doesn't qualify now for treatment, also provided number to the Post COVID Clinic [phone]) if symptoms are persistent.

## 2020-07-26 NOTE — Progress Notes (Unsigned)
Cardiology Office Note:   Date:  07/27/2020  NAME:  Leroy Cochran    MRN: [id number] DOB:  1970-07-21   PCP:  Mila Palmer, MD  Cardiologist:  No primary care provider on file.  Electrophysiologist:  None   Referring MD: Mila Palmer, MD   Chief Complaint  Patient presents with  . Follow-up        History of Present Illness:   Leroy Cochran is a 50 y.o. 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 Zio negative for arrhythmia.  He reports his symptoms of chest pain have improved.  No longer short of breath either.  His monitor was normal.  We discussed he likely is far enough out and with normal work-up to resume activity.  He never had any significant EKG changes.  He never had a drop in LV function.  He had no evidence of arrhythmias.  Symptoms have improved.  We discussed that it is likely okay for him to go back to normal activity and he will let us know how it goes.  I have a low suspicion for ongoing myocarditis.  His blood pressure is 134/88.  He does need to lose weight.  He will work on this.  Unfortunately, he has been quite inactive due to his diagnosis of myocarditis.  He denies any symptoms in the office today.  Problem List 1. Myocarditis  -05/08/2020 -CMR positive -EF 75% -CCTA with normal coronaries, calcium score 0 2. HTN 3. LVH, moderate -moderate on CMR 4. OSA -wears machine  5. Obesity  Past Medical History: Past Medical History:  Diagnosis Date  . Essential hypertension   . Hyperlipidemia   . Insomnia   . OSA on CPAP    Dr.Turner;; CPAP -1, (RDI=82.7, Low O2 - 78%)    Past Surgical History: Past Surgical History:  Procedure Laterality Date  . CPX - CARDIOPULMONARY EXERCISE TEST  04/17/2017    Limited due to submaximal exercise (did not reach target heart rate).  Response.  Some shortness of breath related to abnormal relaxation.  No cardiopulmonary limitations.  Limitations more related to  deconditioning and body habitus.  . WISDOM TOOTH EXTRACTION      Current Medications: Current Meds  Medication Sig  . cetirizine (ZYRTEC) 10 MG tablet Take 10 mg by mouth daily as needed for allergies.  . fluticasone (FLONASE) 50 MCG/ACT nasal spray Place 1 spray into both nostrils daily as needed for allergies.  . pantoprazole (PROTONIX) 40 MG tablet Take 1 tablet (40 mg total) by mouth daily.  . SYMBICORT 80-4.5 MCG/ACT inhaler Inhale 1 puff into the lungs daily.  Marya Landry 40-5-25 MG TABS Take 1 tablet by mouth daily.  . [DISCONTINUED] colchicine 0.6 MG tablet Take 1 tablet (0.6 mg total) by mouth 2 (two) times daily.     Allergies:    Patient has no known allergies.   Social History: Social History   Socioeconomic History  . Marital status: Married    Spouse name: Not on file  . Number of children: 1  . Years of education: Not on file  . Highest education level: High school graduate  Occupational History  . Not on file  Tobacco Use  . Smoking status: Never Smoker  . Smokeless tobacco: Never Used  Substance and Sexual Activity  . Alcohol use: Yes    Comment: occasional wine  . Drug use: No  . Sexual activity: Not on file  Other Topics Concern  .  Not on file  Social History Narrative   He is a divorced father of 1 daughter.  He lives with his daughter.   Social Determinants of Health   Financial Resource Strain: Not on file  Food Insecurity: Not on file  Transportation Needs: Not on file  Physical Activity: Not on file  Stress: Not on file  Social Connections: Not on file     Family History: The patient's family history includes Cervical cancer in his mother; Healthy in his daughter, father, and sister; Heart attack in his maternal grandfather.  ROS:   All other ROS reviewed and negative. Pertinent positives noted in the HPI.     EKGs/Labs/Other Studies Reviewed:   The following studies were personally reviewed by me today:  TTE 05/08/2020 1. Left  ventricular ejection fraction, by estimation, is 70 to 75%. The  left ventricle has hyperdynamic function. The left ventricle has no  regional wall motion abnormalities. There is severe concentric left  ventricular hypertrophy. Left ventricular  diastolic parameters are indeterminate.  2. Right ventricular systolic function is normal. The right ventricular  size is normal.  3. Left atrial size was moderately dilated.  4. The mitral valve is normal in structure. Trivial mitral valve  regurgitation. No evidence of mitral stenosis.  5. The aortic valve is tricuspid. Aortic valve regurgitation is not  visualized. No aortic stenosis is present.  6. The inferior vena cava is dilated in size with <50% respiratory  variability, suggesting right atrial pressure of 15 mmHg.   Zio 05/28/2020 Impression: 1. No arrhythmias detected.  2. Rare ectopy.   CMR 05/08/2020 IMPRESSION: 1. Findings consistent with acute myocarditis, including elevated native T1/T2/ECV and midwall late gadolinium enhancement in the mid anterior wall  2. Normal LV size, moderate concentric hypertrophy, and normal systolic function (EF 61%)  3.  Normal RV size and systolic function (EF 57%)  CCTA 05/08/2020  IMPRESSION: 1. Coronary calcium score of 0.  2. Normal coronary origin with right dominance.  3. Normal coronary arteries.  4. Mid LAD myocardial bridge (normal variant).  5. 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  RECOMMENDATIONS: 1. No evidence of CAD (0%). Consider non-atherosclerotic causes of chest pain.  2. Cardiac MRI is recommended to exclude myocarditis and for further evaluation of apical hypertrophic cardiomyopathy.  Recent Labs: 05/08/2020: B Natriuretic Peptide 44.7; Hemoglobin 12.8; Platelets 288 05/09/2020: BUN 14; Creatinine, Ser 1.25; Potassium 3.6; Sodium 139   Recent Lipid Panel No results found for:  CHOL, TRIG, HDL, CHOLHDL, VLDL, LDLCALC, LDLDIRECT  Physical Exam:   VS:  BP 134/88   Pulse 87   Ht 5\' 11"  (1.803 m)   Wt 255 lb (115.7 kg)   SpO2 96%   BMI 35.57 kg/m    Wt Readings from Last 3 Encounters:  07/27/20 255 lb (115.7 kg)  05/28/20 255 lb (115.7 kg)  05/08/20 249 lb 5.4 oz (113.1 kg)    General: Well nourished, well developed, in no acute distress Head: Atraumatic, normal size  Eyes: PEERLA, EOMI  Neck: Supple, no JVD Endocrine: No thryomegaly Cardiac: Normal S1, S2; RRR; no murmurs, rubs, or gallops Lungs: Clear to auscultation bilaterally, no wheezing, rhonchi or rales  Abd: Soft, nontender, no hepatomegaly  Ext: No edema, pulses 2+ Musculoskeletal: No deformities, BUE and BLE strength normal and equal Skin: Warm and dry, no rashes   Neuro: Alert and oriented to person, place, time, and situation, CNII-XII grossly intact, no focal  deficits  Psych: Normal mood and affect   ASSESSMENT:   Leroy Cochran is a 50 y.o. male who presents for the following: 1. Acute idiopathic myocarditis   2. COVID-19   3. Essential hypertension   4. Obesity (BMI 30-39.9)     PLAN:   1. Acute idiopathic myocarditis 2. COVID-19 -He was diagnosed with myocarditis on 05/08/2020 after receiving his second COVID-19 vaccine shot.  He underwent a coronary CTA that showed no evidence of epicardial coronary disease.  He had minimal troponin elevations.  Cardiac MRI did confirm myocarditis. -His EF has remained normal at 75%. -His chest pain symptoms have resolved. -He recently completed a monitor without any evidence of ventricular arrhythmias. -I think it is safe to say that his myocarditis has resolved.  He may return to full activity without restrictions.  He may stop his colchicine.  I will see him back in 3 months to make sure he is doing well.  3. Essential hypertension -Well-controlled on current blood pressure medications  4. Obesity (BMI 30-39.9) -Diet exercise  recommended.  Disposition: Return in about 3 months (around 10/27/2020).  Medication Adjustments/Labs and Tests Ordered: Current medicines are reviewed at length with the patient today.  Concerns regarding medicines are outlined above.  No orders of the defined types were placed in this encounter.  No orders of the defined types were placed in this encounter.   Patient Instructions  Medication Instructions:  The current medical regimen is effective;  continue present plan and medications.  *If you need a refill on your cardiac medications before your next appointment, please call your pharmacy*   Follow-Up: At [facility], you and your health needs are our priority.  As part of our continuing mission to provide you with exceptional heart care, we have created designated Provider Care Teams.  These Care Teams include your primary Cardiologist (physician) and Advanced Practice Providers (APPs -  Physician Assistants and Nurse Practitioners) who all work together to provide you with the care you need, when you need it.  We recommend signing up for the patient portal called "MyChart".  Sign up information is provided on this After Visit Summary.  MyChart is used to connect with patients for Virtual Visits (Telemedicine).  Patients are able to view lab/test results, encounter notes, upcoming appointments, etc.  Non-urgent messages can be sent to your provider as well.   To learn more about what you can do with MyChart, go to [email or web address].    Your next appointment:   3 month(s)  The format for your next appointment:   In Person  Provider:   Lennie Odor, MD       Time Spent with Patient: I have spent a total of 25 minutes with patient reviewing hospital notes, telemetry, EKGs, labs and examining the patient as well as establishing an assessment and plan that was discussed with the patient.  > 50% of time was spent in direct patient care.  Signed, Lenna Gilford. Flora Lipps, MD,  [facility]  [facility]  [address] [place], [place] [address] [phone]  07/27/2020 4:48 PM

## 2020-07-27 ENCOUNTER — Other Ambulatory Visit: Payer: Self-pay

## 2020-07-27 ENCOUNTER — Ambulatory Visit: Payer: 59 | Admitting: Cardiovascular Disease

## 2020-07-27 ENCOUNTER — Encounter: Payer: Self-pay | Admitting: Cardiovascular Disease

## 2020-07-27 VITALS — BP 134/88 | HR 87 | Ht 71.0 in | Wt 255.0 lb

## 2020-07-27 DIAGNOSIS — E669 Obesity, unspecified: Secondary | ICD-10-CM

## 2020-07-27 DIAGNOSIS — I401 Isolated myocarditis: Secondary | ICD-10-CM

## 2020-07-27 DIAGNOSIS — I10 Essential (primary) hypertension: Secondary | ICD-10-CM | POA: Diagnosis not present

## 2020-07-27 DIAGNOSIS — U071 COVID-19: Secondary | ICD-10-CM | POA: Diagnosis not present

## 2020-07-27 NOTE — Patient Instructions (Signed)
Medication Instructions:  The current medical regimen is effective;  continue present plan and medications.  *If you need a refill on your cardiac medications before your next appointment, please call your pharmacy*   Follow-Up: At [facility], you and your health needs are our priority.  As part of our continuing mission to provide you with exceptional heart care, we have created designated Provider Care Teams.  These Care Teams include your primary Cardiologist (physician) and Advanced Practice Providers (APPs -  Physician Assistants and Nurse Practitioners) who all work together to provide you with the care you need, when you need it.  We recommend signing up for the patient portal called "MyChart".  Sign up information is provided on this After Visit Summary.  MyChart is used to connect with patients for Virtual Visits (Telemedicine).  Patients are able to view lab/test results, encounter notes, upcoming appointments, etc.  Non-urgent messages can be sent to your provider as well.   To learn more about what you can do with MyChart, go to [email or web address].    Your next appointment:   3 month(s)  The format for your next appointment:   In Person  Provider:   Crofton O'Neal, MD     

## 2020-11-17 ENCOUNTER — Ambulatory Visit: Payer: 59 | Admitting: Cardiovascular Disease

## 2021-04-02 NOTE — Progress Notes (Signed)
Cardiology Office Note:   Date:  04/05/2021  NAME:  Leroy Cochran    MRN: [id number] DOB:  01/23/1971   PCP:  Mila Palmer, MD  Cardiologist:  None  Electrophysiologist:  None   Referring MD: Mila Palmer, MD   Chief Complaint  Patient presents with   Follow-up         History of Present Illness:   Leroy Cochran is a 50 y.o. male with a hx of idiopathic myocarditis, hypertension, OSA who presents for follow-up.  He reports he is doing well.  Denies any chest pain or trouble breathing.  His symptoms of myocarditis have resolved.  His blood pressure slightly elevated 142/90.  He reports when he checks it at home it is much lower than this.  His weight is increased roughly 10 pound since her last visit.  He informs me he is working on his diet.  There is a lack of exercise.  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  Reports work is stressful but doing the best he can.  Symptoms of myocarditis have resolved and he seems to be doing well.  Problem List 1. Myocarditis  -05/08/2020 -CMR positive -EF 75% -CCTA with normal coronaries, calcium score 0 2. HTN 3. LVH, moderate -moderate on CMR 4. OSA -wears machine  5. Obesity   Past Medical History: Past Medical History:  Diagnosis Date   Essential hypertension    Hyperlipidemia    Insomnia    OSA on CPAP    Dr.Turner;; CPAP -1, (RDI=82.7, Low O2 - 78%)    Past Surgical History: Past Surgical History:  Procedure Laterality Date   CPX - CARDIOPULMONARY EXERCISE TEST  04/17/2017    Limited due to submaximal exercise (did not reach target heart rate).  Response.  Some shortness of breath related to abnormal relaxation.  No cardiopulmonary limitations.  Limitations more related to deconditioning and body habitus.   WISDOM TOOTH EXTRACTION      Current Medications: Current Meds  Medication Sig   cetirizine (ZYRTEC) 10 MG tablet Take 10 mg by mouth daily as needed for allergies.    fluticasone (FLONASE) 50 MCG/ACT nasal spray Place 1 spray into both nostrils daily as needed for allergies.   SYMBICORT 80-4.5 MCG/ACT inhaler Inhale 1 puff into the lungs daily.   TRIBENZOR 40-5-25 MG TABS Take 1 tablet by mouth daily.     Allergies:    Patient has no known allergies.   Social History: Social History   Socioeconomic History   Marital status: Married    Spouse name: Not on file   Number of children: 1   Years of education: Not on file   Highest education level: High school graduate  Occupational History   Not on file  Tobacco Use   Smoking status: Never   Smokeless tobacco: Never  Substance and Sexual Activity   Alcohol use: Yes    Comment: occasional wine   Drug use: No   Sexual activity: Not on file  Other Topics Concern   Not on file  Social History Narrative   He is a divorced father of 1 daughter.  He lives with his daughter.   Social Determinants of Health   Financial Resource Strain: Not on file  Food Insecurity: Not on file  Transportation Needs: Not on file  Physical Activity: Not on file  Stress: Not on file  Social Connections: Not on file     Family History: The patient's family history includes  Cervical cancer in his mother; Healthy in his daughter, father, and sister; Heart attack in his maternal grandfather.  ROS:   All other ROS reviewed and negative. Pertinent positives noted in the HPI.     EKGs/Labs/Other Studies Reviewed:   The following studies were personally reviewed by me today:  CCTA 05/08/2020 IMPRESSION: 1. Coronary calcium score of 0.   2. Normal coronary origin with right dominance.   3. Normal coronary arteries.   4. Mid LAD myocardial bridge (normal variant).   5. 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  CMR 05/08/2020 IMPRESSION: 1. Findings consistent with acute myocarditis, including elevated native T1/T2/ECV and midwall late  gadolinium enhancement in the mid anterior wall   2. Normal LV size, moderate concentric hypertrophy, and normal systolic function (EF 61%)   3.  Normal RV size and systolic function (EF 57%)  Recent Labs: 05/08/2020: B Natriuretic Peptide 44.7; Hemoglobin 12.8; Platelets 288 05/09/2020: BUN 14; Creatinine, Ser 1.25; Potassium 3.6; Sodium 139   Recent Lipid Panel No results found for: CHOL, TRIG, HDL, CHOLHDL, VLDL, LDLCALC, LDLDIRECT  Physical Exam:   VS:  BP (!) 142/90 (BP Location: Left Arm, Patient Position: Sitting, Cuff Size: Normal)   Pulse 69   Ht 6' (1.829 m)   Wt 266 lb 9.6 oz (120.9 kg)   SpO2 98%   BMI 36.16 kg/m    Wt Readings from Last 3 Encounters:  04/05/21 266 lb 9.6 oz (120.9 kg)  07/27/20 255 lb (115.7 kg)  05/28/20 255 lb (115.7 kg)    General: Well nourished, well developed, in no acute distress Head: Atraumatic, normal size  Eyes: PEERLA, EOMI  Neck: Supple, no JVD Endocrine: No thryomegaly Cardiac: Normal S1, S2; RRR; no murmurs, rubs, or gallops Lungs: Clear to auscultation bilaterally, no wheezing, rhonchi or rales  Abd: Soft, nontender, no hepatomegaly  Ext: No edema, pulses 2+ Musculoskeletal: No deformities, BUE and BLE strength normal and equal Skin: Warm and dry, no rashes   Neuro: Alert and oriented to person, place, time, and situation, CNII-XII grossly intact, no focal deficits  Psych: Normal mood and affect   ASSESSMENT:   Leroy Cochran is a 50 y.o. male who presents for the following: 1. Acute idiopathic myocarditis   2. Essential hypertension     PLAN:   1. Acute idiopathic myocarditis -Diagnosed with myocarditis in December 2021.  Coronary CTA was normal with no evidence of coronary calcium.  This was detected on cardiac MRI.  Etiology likely related to COVID-19 vaccine.  He did have his vaccine with in close proximity to his symptoms and diagnosis. -Echocardiogram shows normal LV function. -Recent monitor shows no  arrhythmias. -He has had no further symptoms.  He has no further restrictions.  He will see [place] once a year.  2. Essential hypertension -Blood pressure well controlled.  Continue current medications.  Disposition: Return in about 1 year (around 04/05/2022).  Medication Adjustments/Labs and Tests Ordered: Current medicines are reviewed at length with the patient today.  Concerns regarding medicines are outlined above.  No orders of the defined types were placed in this encounter.  No orders of the defined types were placed in this encounter.   Patient Instructions  Medication Instructions:  The current medical regimen is effective;  continue present plan and medications.  *If you need a refill on your cardiac medications before your next appointment, please call your pharmacy*  Follow-Up: At [facility], you and  your health needs are our priority.  As part of our continuing mission to provide you with exceptional heart care, we have created designated Provider Care Teams.  These Care Teams include your primary Cardiologist (physician) and Advanced Practice Providers (APPs -  Physician Assistants and Nurse Practitioners) who all work together to provide you with the care you need, when you need it.  We recommend signing up for the patient portal called "MyChart".  Sign up information is provided on this After Visit Summary.  MyChart is used to connect with patients for Virtual Visits (Telemedicine).  Patients are able to view lab/test results, encounter notes, upcoming appointments, etc.  Non-urgent messages can be sent to your provider as well.   To learn more about what you can do with MyChart, go to [email or web address].    Your next appointment:   12 month(s)  The format for your next appointment:   In Person  Provider:   Lennie Odor, MD     Time Spent with Patient: I have spent a total of 25 minutes with patient reviewing hospital notes, telemetry, EKGs, labs and  examining the patient as well as establishing an assessment and plan that was discussed with the patient.  > 50% of time was spent in direct patient care.  Signed, Lenna Gilford. Flora Lipps, MD, [facility]  [facility]  [address], [address] [place], [place] [address] [phone]  04/05/2021 10:06 AM

## 2021-04-05 ENCOUNTER — Encounter: Payer: Self-pay | Admitting: Cardiovascular Disease

## 2021-04-05 ENCOUNTER — Ambulatory Visit: Payer: 59 | Admitting: Cardiovascular Disease

## 2021-04-05 ENCOUNTER — Other Ambulatory Visit: Payer: Self-pay

## 2021-04-05 VITALS — BP 142/90 | HR 69 | Ht 72.0 in | Wt 266.6 lb

## 2021-04-05 DIAGNOSIS — I10 Essential (primary) hypertension: Secondary | ICD-10-CM

## 2021-04-05 DIAGNOSIS — I401 Isolated myocarditis: Secondary | ICD-10-CM | POA: Diagnosis not present

## 2021-04-05 NOTE — Patient Instructions (Signed)

## 2021-12-15 ENCOUNTER — Other Ambulatory Visit: Payer: Self-pay

## 2021-12-15 ENCOUNTER — Emergency Department (HOSPITAL_BASED_OUTPATIENT_CLINIC_OR_DEPARTMENT_OTHER): Payer: 59 | Admitting: Radiology

## 2021-12-15 ENCOUNTER — Emergency Department (HOSPITAL_BASED_OUTPATIENT_CLINIC_OR_DEPARTMENT_OTHER)
Admission: EM | Admit: 2021-12-15 | Discharge: 2021-12-15 | Disposition: A | Discharge status: Home / Self Care | Payer: 59 | Attending: Emergency Medicine | Admitting: Emergency Medicine

## 2021-12-15 ENCOUNTER — Encounter (HOSPITAL_BASED_OUTPATIENT_CLINIC_OR_DEPARTMENT_OTHER): Payer: Self-pay

## 2021-12-15 DIAGNOSIS — R001 Bradycardia, unspecified: Secondary | ICD-10-CM | POA: Diagnosis not present

## 2021-12-15 DIAGNOSIS — R072 Precordial pain: Secondary | ICD-10-CM | POA: Insufficient documentation

## 2021-12-15 DIAGNOSIS — E876 Hypokalemia: Secondary | ICD-10-CM | POA: Diagnosis not present

## 2021-12-15 DIAGNOSIS — R079 Chest pain, unspecified: Secondary | ICD-10-CM

## 2021-12-15 LAB — BASIC METABOLIC PANEL
Anion gap: 11 (ref 5–15)
BUN: 17 mg/dL (ref 6–20)
CO2: 27 mmol/L (ref 22–32)
Calcium: 9.7 mg/dL (ref 8.9–10.3)
Chloride: 104 mmol/L (ref 98–111)
Creatinine, Ser: 1.07 mg/dL (ref 0.61–1.24)
GFR, Estimated: >60 mL/min (ref >60)
Glucose, Bld: 102 mg/dL — ABNORMAL HIGH (ref 70–99)
Potassium: 3.3 mmol/L — ABNORMAL LOW (ref 3.5–5.1)
Sodium: 142 mmol/L (ref 135–145)

## 2021-12-15 LAB — CBC
HCT: 45.1 % (ref 39.0–52.0)
Hemoglobin: 14.5 g/dL (ref 13.0–17.0)
MCH: 26.1 pg (ref 26.0–34.0)
MCHC: 32.2 g/dL (ref 30.0–36.0)
MCV: 81.3 fL (ref 80.0–100.0)
Platelets: 247 10*3/uL (ref 150–400)
RBC: 5.55 MIL/uL (ref 4.22–5.81)
RDW: 14.5 % (ref 11.5–15.5)
WBC: 6.8 10*3/uL (ref 4.0–10.5)
nRBC: 0 % (ref 0.0–0.2)

## 2021-12-15 LAB — TROPONIN I (HIGH SENSITIVITY)
Troponin I (High Sensitivity): 4 ng/L (ref <18)
Troponin I (High Sensitivity): 4 ng/L (ref <18)

## 2021-12-15 MED ORDER — POTASSIUM CHLORIDE CRYS ER 20 MEQ PO TBCR
20.0000 meq | EXTENDED_RELEASE_TABLET | Freq: Once | ORAL | Status: AC
  Administered 2021-12-15: 20 meq via ORAL
  Filled 2021-12-15: qty 1

## 2021-12-15 NOTE — ED Notes (Signed)
Patient transported to X-ray 

## 2021-12-15 NOTE — Discharge Instructions (Addendum)
Please schedule an appointment with cardiology in order to obtain further evaluation of your ongoing chest pain.  If you experience any shortness of breath, worsening symptoms you will need to return to the emergency department.

## 2021-12-15 NOTE — ED Provider Notes (Signed)
[facility] EMERGENCY DEPT Provider Note   CSN: [id number] Arrival date & time: 12/15/21  1003     History  Chief Complaint  Patient presents with   Chest Pain    Leroy Cochran is a 51 y.o. male.  51 y.o male with a PMH of Myocarditis presents to the ED with a chief complaint of chest pain x 6 days. Patient reports over the weekend he had some severe left-sided sharp pain has been coming and going, the pain began while he was resting and not exerting himself.  He reports pain was so severe in nature that he thought about seeking help in the emergency department.  Now he rates the pain is about a 2 out of 10.  He is recently been followed by cardiology Dr. 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 Patient saw his PCP today and had abnormal EKG and sent to the ED for further evaluation. Patient had a previous stress test and echo.  Also reports changes in his diet, feels somewhat "bloated and gaseous after eating meat.  Nuys any prior history of CAD, his grandfather did have a heart attack in his 52s, no tobacco use.    The history is provided by the patient.  Chest Pain Pain location:  L chest Pain quality: sharp   Pain radiates to:  Precordial region Pain severity:  Moderate Onset quality:  Gradual Duration:  5 days Timing:  Intermittent Progression:  Partially resolved Chronicity:  Recurrent Relieved by:  Rest Worsened by:  Nothing Ineffective treatments:  None tried Associated symptoms: no abdominal pain, no back pain, no cough, no diaphoresis, no fever, no lower extremity edema, no nausea, no numbness, no palpitations, no shortness of breath and no vomiting   Risk factors: male sex   Risk factors: no coronary artery disease, no diabetes mellitus, no hypertension, no prior DVT/PE and no smoking        Home Medications Prior to  Admission medications   Medication Sig Start Date End Date Taking? Authorizing Provider  carvedilol (COREG) 6.25 MG tablet Take 6.25 mg by mouth 2 (two) times daily. 12/03/21  Yes [provider]  escitalopram (LEXAPRO) 10 MG tablet Take 10 mg by mouth daily. 11/29/21  Yes [provider]  TRIBENZOR 40-5-25 MG TABS Take 1 tablet by mouth daily. 03/30/20  Yes [provider]  cetirizine (ZYRTEC) 10 MG tablet Take 10 mg by mouth daily as needed for allergies.    [provider]  fluticasone (FLONASE) 50 MCG/ACT nasal spray Place 1 spray into both nostrils daily as needed for allergies. 10/15/15   [provider]  SYMBICORT 80-4.5 MCG/ACT inhaler Inhale 1 puff into the lungs daily. 04/29/20   [provider]      Allergies    Patient has no known allergies.    Review of Systems   Review of Systems  Constitutional:  Negative for diaphoresis and fever.  HENT:  Negative for sore throat.   Respiratory:  Negative for cough and shortness of breath.   Cardiovascular:  Positive for chest pain. Negative for palpitations.  Gastrointestinal:  Negative for abdominal pain, diarrhea, nausea and vomiting.  Genitourinary:  Negative for flank pain.  Musculoskeletal:  Negative for back pain.  Neurological:  Negative for numbness.  All other systems reviewed and are negative.   Physical Exam Updated Vital Signs BP 136/88  Pulse (!) 50   Temp 98.7 F (37.1 C) (Oral)   Resp (!) 21   SpO2 98%  Physical Exam Vitals and nursing note reviewed.  Constitutional:      Appearance: He is well-developed.  HENT:     Head: Normocephalic and atraumatic.  Eyes:     General: No scleral icterus.    Pupils: Pupils are equal, round, and reactive to light.  Cardiovascular:     Heart sounds: Normal heart sounds.  Pulmonary:     Effort: Pulmonary effort is normal.     Breath sounds: Normal breath sounds. No wheezing.  Chest:     Chest wall: No tenderness.   Abdominal:     General: Bowel sounds are normal. There is no distension.     Palpations: Abdomen is soft.     Tenderness: There is no abdominal tenderness.  Musculoskeletal:        General: No tenderness or deformity.     Cervical back: Normal range of motion.  Skin:    General: Skin is warm and dry.  Neurological:     Mental Status: He is alert and oriented to person, place, and time.     ED Results / Procedures / Treatments   Labs (all labs ordered are listed, but only abnormal results are displayed) Labs Reviewed  BASIC METABOLIC PANEL - Abnormal; Notable for the following components:      Result Value   Potassium 3.3 (*)    Glucose, Bld 102 (*)    All other components within normal limits  CBC  TROPONIN I (HIGH SENSITIVITY)  TROPONIN I (HIGH SENSITIVITY)    EKG EKG Interpretation  Date/Time:  Wednesday December 15 2021 10:12:36 EDT Ventricular Rate:  57 PR Interval:  148 QRS Duration: 96 QT Interval:  404 QTC Calculation: 393 R Axis:   21 Text Interpretation: Sinus bradycardia with Premature atrial complexes ST & T wave abnormality, consider inferolateral ischemia Abnormal ECG When compared with ECG of 07-May-2020 22:59, PREVIOUS ECG IS PRESENT when compared to prior, improved t wave inversions to prior. No STEMI Confirmed by Theda Belfast ([id number]) on 12/15/2021 12:17:42 PM  Radiology DG Chest 2 View  Result Date: 12/15/2021 CLINICAL DATA:  Chest pain.  Abnormal EKG. EXAM: CHEST - 2 VIEW COMPARISON:  Radiographs 05/07/2020.  CT 05/08/2020. FINDINGS: Suboptimal inspiration on both views. The heart size and mediastinal contours are stable. There is mild atelectasis at both lung bases. The lungs are otherwise clear. There is no pleural effusion or pneumothorax. No acute osseous findings. IMPRESSION: Suboptimal inspiration. No evidence of active cardiopulmonary process. Electronically Signed   By: Carey Bullocks M.D.   On: 12/15/2021 10:45    Procedures Procedures     Medications Ordered in ED Medications  potassium chloride SA (KLOR-CON M) CR tablet 20 mEq (has no administration in time range)    ED Course/ Medical Decision Making/ A&P                           Medical Decision Making Amount and/or Complexity of Data Reviewed Labs: ordered. Radiology: ordered.    This patient presents to the ED for concern of chest pain, this involves a number of treatment options, and is a complaint that carries with it a high risk of complications and morbidity.  The differential diagnosis includes ACS, recurrent myocarditis versus infection.    Co morbidities: Discussed in HPI   Brief History:  Past medical history of myocarditis here  with chest pain which began    EMR reviewed including pt PMHx, past surgical history and past visits to ER.   See HPI for more details   Lab Tests:  I ordered and independently interpreted labs.  The pertinent results include:    I personally reviewed all laboratory work and imaging. Metabolic panel without any acute abnormality specifically kidney function within normal limits and no significant electrolyte abnormalities. CBC without leukocytosis or significant anemia.   Imaging Studies:  NAD. I personally reviewed all imaging studies and no acute abnormality found. I agree with radiology interpretation.   Cardiac Monitoring:  The patient was maintained on a cardiac monitor.  I personally viewed and interpreted the cardiac monitored which showed an underlying rhythm of: NSR  EKG non-ischemic  Please see EKG's from PCP visit:       Medicines ordered:  N/A  Consults:  I requested consultation with cardiology Dr. [place],  and discussed lab and imaging findings as well as pertinent plan - they recommend: outpatient follow up with Dr. Scharlene Gloss.   Reevaluation:  After the interventions noted above I re-evaluated patient and found that they have :stayed the same   Social Determinants of  Health:  The patient's social determinants of health were a factor in the care of this patient    Problem List / ED Course:  Here with prior history of myocarditis presents to the ED with chest pain has been ongoing for the past couple of days, exacerbated over the weekend.  Does follow-up with cardiology however this is every 6 months and last saw them in November.  His cardiologist Dr. Bufford Buttner has had an echo, stress test in the past.  Today's visit he reports the pain is not as severe, his labs are within normal limits.  He did have an abnormal EKG at PCPs office, however this is repeated here and it looks to be within his priors.  CBC is without any acute findings.  BMP is unremarkable with slight hypokalemia.  Troponins x2 have remained flat.  Chest x-ray did not show any infection.  Spoke to cardiology who reviewed EKG extensively no signs of ischemia, we feel that patient can follow-up outpatient for further management.  I do not suspect PE without any tachycardia or hypoxia.  No signs of infection on straight and no fever   Dispostion:  After consideration of the diagnostic results and the patients response to treatment, I feel that the patent would benefit from outpatient follow-up with cardiology.    Portions of this note were generated with Scientist, clinical (histocompatibility and immunogenetics). Dictation errors may occur despite best attempts at proofreading.   Final Clinical Impression(s) / ED Diagnoses Final diagnoses:  Chest pain, unspecified type    Rx / DC Orders ED Discharge Orders     None         Claude Manges, PA-C 12/15/21 1546    Tegeler, Canary Brim, MD 12/15/21 1550

## 2021-12-15 NOTE — ED Triage Notes (Signed)
Pt presents POV from [facility] wellness clinic for an abnormal EKG. Pt was originally seen for a routine visit today.  Pt reports Left side chest pain and feeling bloated x2-3 days. Pt denies dizziness, N/V

## 2021-12-15 NOTE — ED Notes (Signed)
Left sided CP since Sat last week. Worsens upon movement and palpation. Lessened since Sat. Saw PCP today and they noted changes to his EKG. No N/V/D, headache/fainting or any other aliment. Rates it 5/10

## 2022-03-24 NOTE — Progress Notes (Signed)
Cardiology Office Note:   Date:  03/25/2022  NAME:  Leroy Cochran    MRN: [id number] DOB:  [birth year]   PCP:  Mila Palmer, MD  Cardiologist:  None  Electrophysiologist:  None   Referring MD: Mila Palmer, MD   Chief Complaint  Patient presents with   Follow-up   Chest Pain    None lately.    History of Present Illness:   Leroy Cochran is a 51 y.o. male with a hx of myocarditis who presents for follow-up.  He was seen in the emergency room in July for chest pain.  Describes sharp chest discomfort associated with high blood pressure.  Symptoms have improved with blood pressure control.  He reports no chest pain recently.  Denies any symptoms of shortness of breath.  His blood pressure is 118/80.  He is working with wellness to lose weight.   Problem List 1. Myocarditis  -05/08/2020 -CMR positive -EF 75% -CCTA with normal coronaries, calcium score 0 2. HTN 3. LVH, moderate -moderate on CMR 4. OSA -wears machine  5. Obesity  Past Medical History: Past Medical History:  Diagnosis Date   Essential hypertension    Hyperlipidemia    Insomnia    OSA on CPAP    Dr.Turner;; CPAP -1, (RDI=82.7, Low O2 - 78%)    Past Surgical History: Past Surgical History:  Procedure Laterality Date   CPX - CARDIOPULMONARY EXERCISE TEST  04/17/2017    Limited due to submaximal exercise (did not reach target heart rate).  Response.  Some shortness of breath related to abnormal relaxation.  No cardiopulmonary limitations.  Limitations more related to deconditioning and body habitus.   WISDOM TOOTH EXTRACTION      Current Medications: Current Meds  Medication Sig   carvedilol (COREG) 6.25 MG tablet Take 6.25 mg by mouth 2 (two) times daily.   cetirizine (ZYRTEC) 10 MG tablet Take 10 mg by mouth daily as needed for allergies.   fluticasone (FLONASE) 50 MCG/ACT nasal spray Place 1 spray into both nostrils daily as needed for allergies.   SYMBICORT 80-4.5 MCG/ACT inhaler Inhale 1  puff into the lungs daily.   TRIBENZOR 40-5-25 MG TABS Take 1 tablet by mouth daily.   [DISCONTINUED] escitalopram (LEXAPRO) 10 MG tablet Take 10 mg by mouth daily.     Allergies:    Patient has no known allergies.   Social History: Social History   Socioeconomic History   Marital status: Divorced    Spouse name: Not on file   Number of children: 1   Years of education: Not on file   Highest education level: High school graduate  Occupational History   Not on file  Tobacco Use   Smoking status: Never   Smokeless tobacco: Never  Substance and Sexual Activity   Alcohol use: Yes    Comment: occasional wine   Drug use: No   Sexual activity: Not on file  Other Topics Concern   Not on file  Social History Narrative   He is a divorced father of 1 daughter.  He lives with his daughter.   Social Determinants of Health   Financial Resource Strain: Not on file  Food Insecurity: Not on file  Transportation Needs: Not on file  Physical Activity: Insufficiently Active (04/02/2017)   Exercise Vital Sign    Days of Exercise per Week: 1 day    Minutes of Exercise per Session: 20 min  Stress: Not on file  Social Connections: Not on file  Family History: The patient's family history includes Cervical cancer in his mother; Healthy in his daughter, father, and sister; Heart attack in his maternal grandfather.  ROS:   All other ROS reviewed and negative. Pertinent positives noted in the HPI.     EKGs/Labs/Other Studies Reviewed:   The following studies were personally reviewed by me today:  CMR 05/08/2020  IMPRESSION: 1. Findings consistent with acute myocarditis, including elevated native T1/T2/ECV and midwall late gadolinium enhancement in the mid anterior wall   2. Normal LV size, moderate concentric hypertrophy, and normal systolic function (EF 14%)   3.  Normal RV size and systolic function (EF 78%)    Recent Labs: 12/15/2021: BUN 17; Creatinine, Ser 1.07;  Hemoglobin 14.5; Platelets 247; Potassium 3.3; Sodium 142   Recent Lipid Panel No results found for: "CHOL", "TRIG", "HDL", "CHOLHDL", "VLDL", "LDLCALC", "LDLDIRECT"  Physical Exam:   VS:  BP 118/80 (BP Location: Left Arm, Patient Position: Sitting, Cuff Size: Large)   Ht 6' (1.829 m)   Wt 267 lb (121.1 kg)   BMI 36.21 kg/m    Wt Readings from Last 3 Encounters:  03/25/22 267 lb (121.1 kg)  04/05/21 266 lb 9.6 oz (120.9 kg)  07/27/20 255 lb (115.7 kg)    General: Well nourished, well developed, in no acute distress Head: Atraumatic, normal size  Eyes: PEERLA, EOMI  Neck: Supple, no JVD Endocrine: No thryomegaly Cardiac: Normal S1, S2; RRR; no murmurs, rubs, or gallops Lungs: Clear to auscultation bilaterally, no wheezing, rhonchi or rales  Abd: Soft, nontender, no hepatomegaly  Ext: No edema, pulses 2+ Musculoskeletal: No deformities, BUE and BLE strength normal and equal Skin: Warm and dry, no rashes   Neuro: Alert and oriented to person, place, time, and situation, CNII-XII grossly intact, no focal deficits  Psych: Normal mood and affect   ASSESSMENT:   Leroy Cochran is a 51 y.o. male who presents for the following: 1. Essential hypertension   2. Acute idiopathic myocarditis   3. Obesity (BMI 30-39.9)     PLAN:   1. Essential hypertension -Well-controlled on current medications.  2. Acute idiopathic myocarditis -No recurrence of symptoms.  Chest pain symptoms as some of her like related to high blood pressure.  No further work-up needed.  3. Obesity (BMI 30-39.9) -Weight loss recommended.      Disposition: Return in about 1 year (around 03/26/2023).  Medication Adjustments/Labs and Tests Ordered: Current medicines are reviewed at length with the patient today.  Concerns regarding medicines are outlined above.  No orders of the defined types were placed in this encounter.  No orders of the defined types were placed in this encounter.   Patient Instructions   Medication Instructions:  The current medical regimen is effective;  continue present plan and medications.  *If you need a refill on your cardiac medications before your next appointment, please call your pharmacy*  Follow-Up: At [facility], you and your health needs are our priority.  As part of our continuing mission to provide you with exceptional heart care, we have created designated Provider Care Teams.  These Care Teams include your primary Cardiologist (physician) and Advanced Practice Providers (APPs -  Physician Assistants and Nurse Practitioners) who all work together to provide you with the care you need, when you need it.  We recommend signing up for the patient portal called "MyChart".  Sign up information is provided on this After Visit Summary.  MyChart is used to connect with patients for Virtual Visits (  Telemedicine).  Patients are able to view lab/test results, encounter notes, upcoming appointments, etc.  Non-urgent messages can be sent to your provider as well.   To learn more about what you can do with MyChart, go to [email or web address].    Your next appointment:   12 month(s)  The format for your next appointment:   In Person  Provider:   Lennie Odor, MD          Time Spent with Patient: I have spent a total of 25 minutes with patient reviewing hospital notes, telemetry, EKGs, labs and examining the patient as well as establishing an assessment and plan that was discussed with the patient.  > 50% of time was spent in direct patient care.  Signed, Lenna Gilford. Flora Lipps, MD, [facility]  [facility]  [address], [address] [place], [place] [address] [phone]  03/25/2022 9:52 AM

## 2022-03-25 ENCOUNTER — Ambulatory Visit: Payer: 59 | Attending: Cardiovascular Disease | Admitting: Cardiovascular Disease

## 2022-03-25 ENCOUNTER — Encounter: Payer: Self-pay | Admitting: Cardiovascular Disease

## 2022-03-25 VITALS — BP 118/80 | Ht 72.0 in | Wt 267.0 lb

## 2022-03-25 DIAGNOSIS — I10 Essential (primary) hypertension: Secondary | ICD-10-CM

## 2022-03-25 DIAGNOSIS — E669 Obesity, unspecified: Secondary | ICD-10-CM

## 2022-03-25 DIAGNOSIS — I401 Isolated myocarditis: Secondary | ICD-10-CM

## 2022-03-25 NOTE — Patient Instructions (Signed)
Medication Instructions:  The current medical regimen is effective;  continue present plan and medications.  *If you need a refill on your cardiac medications before your next appointment, please call your pharmacy*   Follow-Up: At [facility], you and your health needs are our priority.  As part of our continuing mission to provide you with exceptional heart care, we have created designated Provider Care Teams.  These Care Teams include your primary Cardiologist (physician) and Advanced Practice Providers (APPs -  Physician Assistants and Nurse Practitioners) who all work together to provide you with the care you need, when you need it.  We recommend signing up for the patient portal called "MyChart".  Sign up information is provided on this After Visit Summary.  MyChart is used to connect with patients for Virtual Visits (Telemedicine).  Patients are able to view lab/test results, encounter notes, upcoming appointments, etc.  Non-urgent messages can be sent to your provider as well.   To learn more about what you can do with MyChart, go to [email or web address].    Your next appointment:   12 month(s)  The format for your next appointment:   In Person  Provider:   [place] O'Neal, MD       

## 2022-07-22 IMAGING — CT CT HEART MORP W/ CTA COR W/ SCORE W/ CA W/CM &/OR W/O CM
1 of 2 series · 11 of 20 positions shown, 14 images · non-contrast
Comparison: None.
COMPARISON: None.

Addendum:
EXAM:
OVER-READ INTERPRETATION  CT CHEST

The following report is an over-read performed by radiologist Dr.
Kya Work [REDACTED] on 05/08/2020. This
over-read does not include interpretation of cardiac or coronary
anatomy or pathology. The coronary calcium score/coronary CTA
interpretation by the cardiologist is attached.
CLINICAL DATA: Chest pain
Cardiac/Coronary CTA
TECHNIQUE: The patient was scanned on a Phillips Force scanner. A 100 kV
prospective scan was triggered in the descending thoracic aorta at
111 HU's. Axial non-contrast 3 mm slices were carried out through
the heart. The data set was analyzed on a dedicated work station and
scored using the Agatson method. Gantry rotation speed was 250 msecs
and collimation was .6 mm. No beta blockade and 0.8 mg of sl NTG was
given. The 3D data set was reconstructed in 5% intervals of the
35-75 % of the R-R cycle. Diastolic phases were analyzed on a
dedicated work station using MPR, MIP and VRT modes. The patient
received 80 cc of contrast.

[Series 220: findings · 11 of 34 slices shown, 14 images]
[im 3/34  vessel]
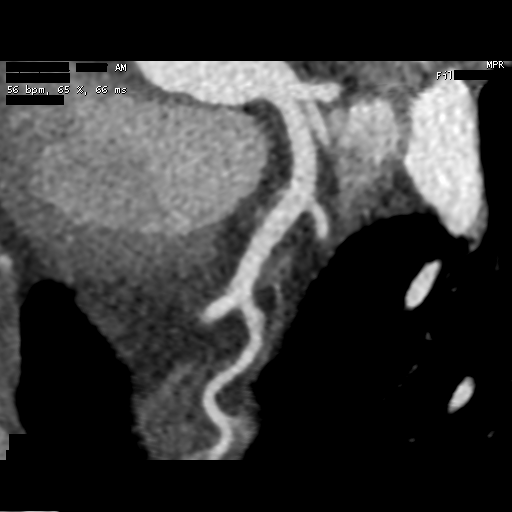
[im 3/34  lung]
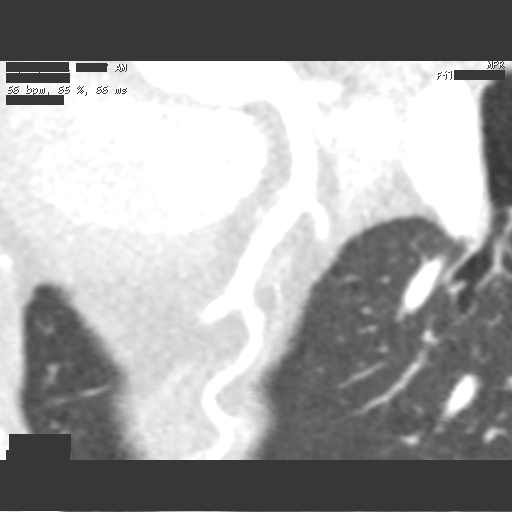
[im 6/34  vessel]
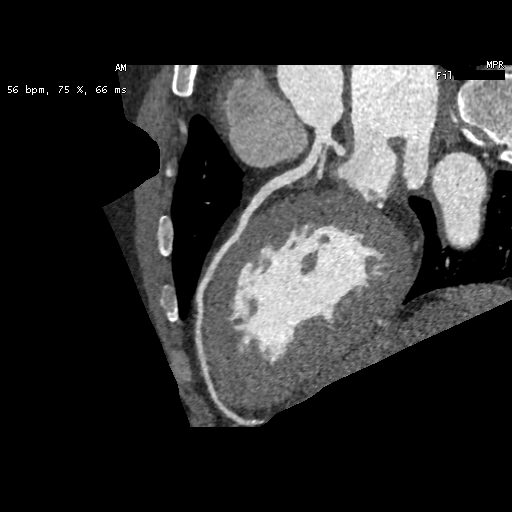
[im 9/34  vessel]
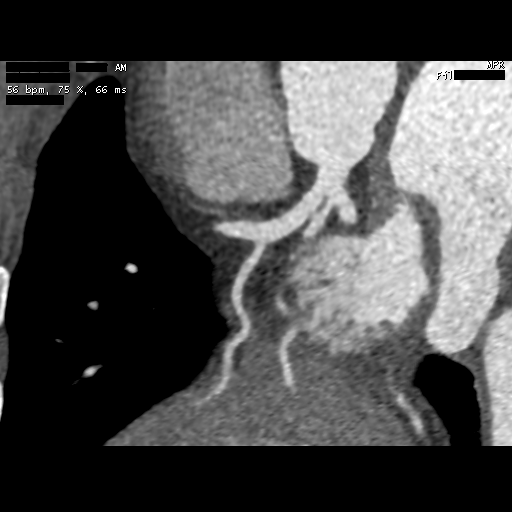
[im 12/34  vessel]
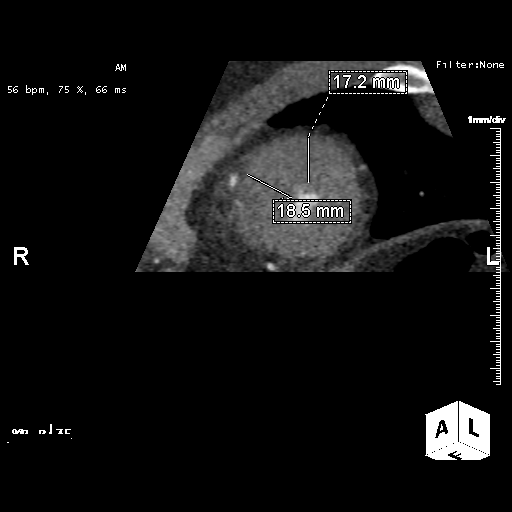
[im 14/34  vessel]
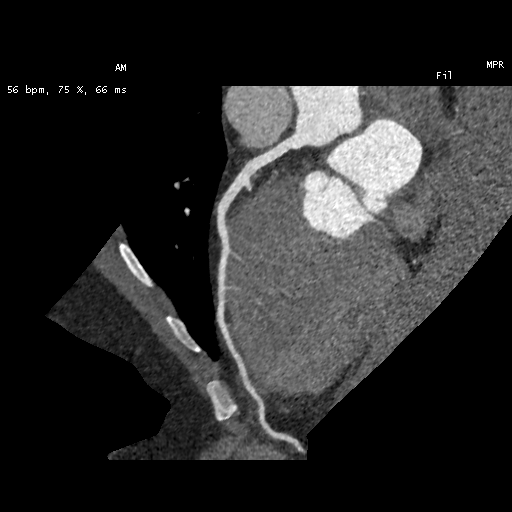
[im 14/34  lung]
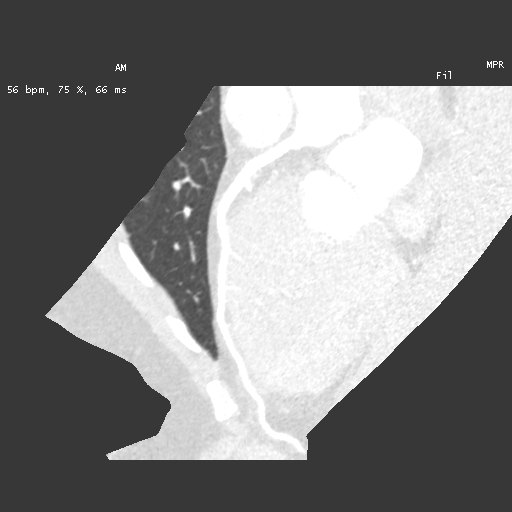
[im 17/34  vessel]
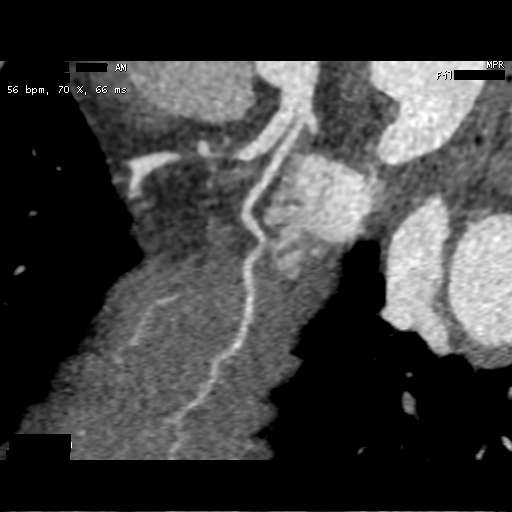
[im 20/34  vessel]
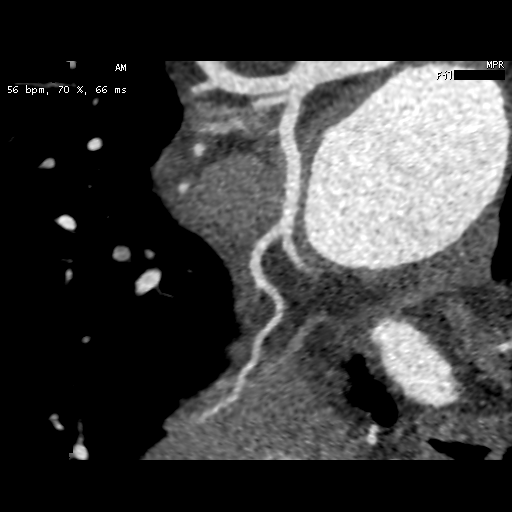
[im 23/34  vessel]
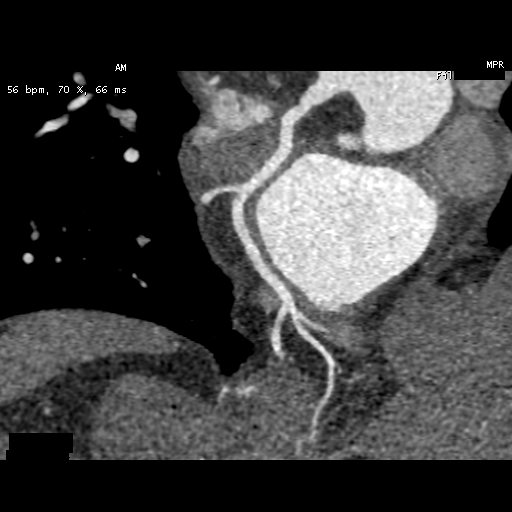
[im 25/34  vessel]
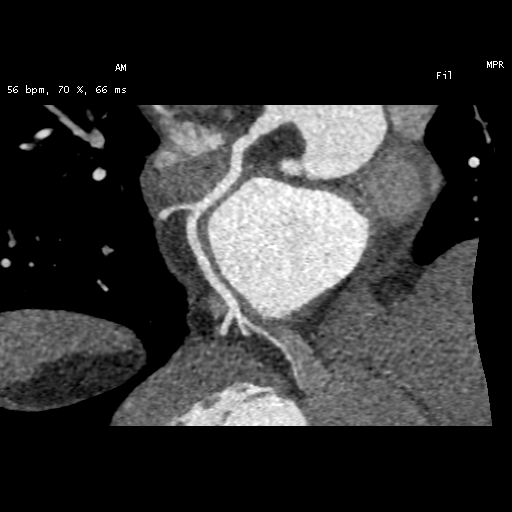
[im 25/34  lung]
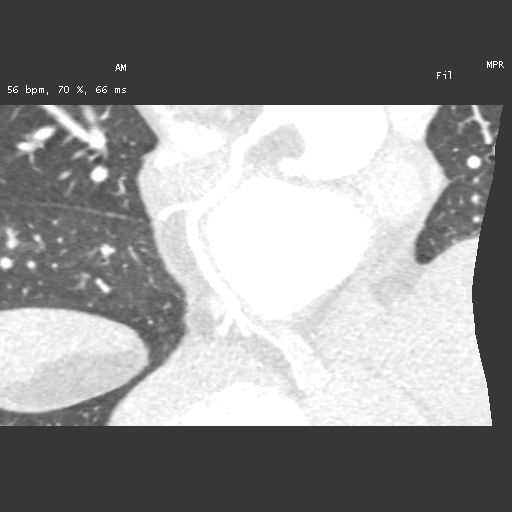
[im 28/34  vessel]
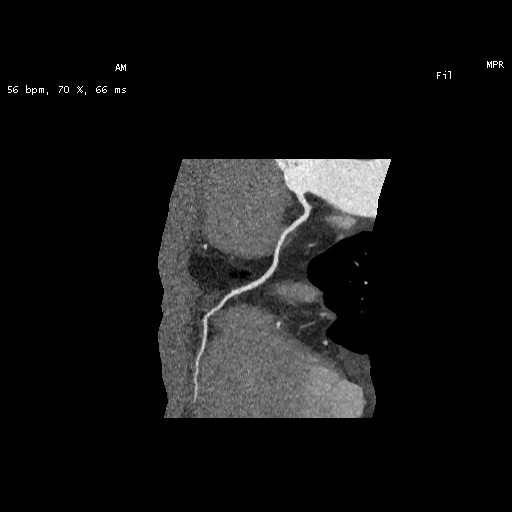
[im 31/34  vessel]
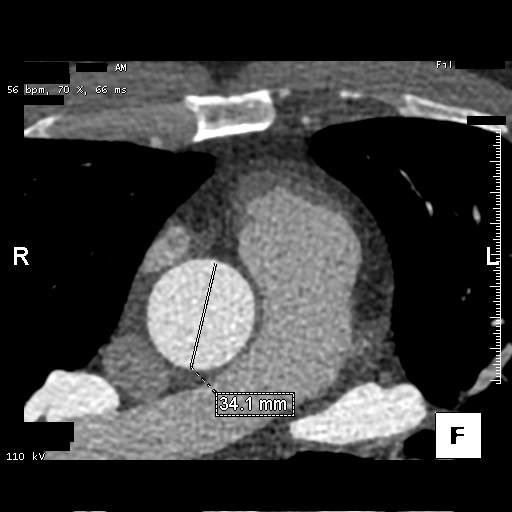

[11 of 20 positions shown; findings below may reference images not displayed]

FINDINGS: Within the visualized portions of the thorax there are no suspicious
appearing pulmonary nodules or masses, there is no acute
consolidative airspace disease, no pleural effusions, no
pneumothorax and no lymphadenopathy. Visualized portions of the
upper abdomen are unremarkable. There are no aggressive appearing
lytic or blastic lesions noted in the visualized portions of the
skeleton.
IMPRESSION: No significant incidental noncardiac findings are noted.
FINDINGS: Image quality: excellent.

Noise artifact is: Limited.

Coronary Arteries:  Normal coronary origin.  Right dominance.

Left main: The left main is a large caliber vessel with a normal
take off from the left coronary cusp that trifurcates into a LAD,
LCX, and ramus intermedius. There is no plaque or stenosis.

Left anterior descending artery: The LAD is patent without evidence
of plaque or stenosis. There is a mid LAD myocardial bridge. The LAD
gives off 2 patent diagonal branches. The LAD is a large wrap around
vessel and supplies the distal [DATE] of the posterior left ventricle.

Ramus intermedius: Patent with no evidence of plaque or stenosis.

Left circumflex artery: The LCX is non-dominant and patent with no
evidence of plaque or stenosis. The LCX gives off 2 patent obtuse
marginal branches and a patent Roger Diego Hayashida.

Right coronary artery: The RCA is dominant with normal take off from
the right coronary cusp. There is no evidence of plaque or stenosis.
The RCA terminates as a PDA without evidence of plaque or stenosis.

Right Atrium: Right atrial size is within normal limits.

Right Ventricle: The right ventricular cavity is within normal
limits.

Left Atrium: Left atrial size is normal in size with no left atrial
appendage filling defect.

Left Ventricle: The ventricular cavity size is within normal limits.
There are no stigmata of prior infarction. There is no abnormal
filling defect. There is severe asymmetric left ventricular
hypertrophy of the apical myocardium concerning for apical variant
hypertrophic cardiomyopathy (measures up to 19 mm). No apical
aneurysm seen.

Pulmonary arteries: Normal in size without proximal filling defect.

Pulmonary veins: Normal pulmonary venous drainage.

Pericardium: Normal thickness with no significant effusion or
calcium present.

Cardiac valves: The aortic valve is trileaflet without significant
calcification. The mitral valve is normal structure without
significant calcification.

Aorta: Normal caliber with no significant disease.

Extra-cardiac findings: See attached radiology report for
non-cardiac structures.
IMPRESSION: 1. Coronary calcium score of 0.

2. Normal coronary origin with right dominance.

3. Normal coronary arteries.

4. Mid LAD myocardial bridge (normal variant).

5. Severe asymmetric LVH (up to 19 mm) of the apex concerning for
apical variant hypertrophic cardiomyopathy.

RECOMMENDATIONS:
1. No evidence of CAD (0%). Consider non-atherosclerotic causes of
chest pain.

2. Cardiac MRI is recommended to exclude myocarditis and for further
evaluation of apical hypertrophic cardiomyopathy.

*** End of Addendum ***
EXAM:
OVER-READ INTERPRETATION  CT CHEST

The following report is an over-read performed by radiologist Dr.
Kya Work [REDACTED] on 05/08/2020. This
over-read does not include interpretation of cardiac or coronary
anatomy or pathology. The coronary calcium score/coronary CTA
interpretation by the cardiologist is attached.
FINDINGS: Within the visualized portions of the thorax there are no suspicious
appearing pulmonary nodules or masses, there is no acute
consolidative airspace disease, no pleural effusions, no
pneumothorax and no lymphadenopathy. Visualized portions of the
upper abdomen are unremarkable. There are no aggressive appearing
lytic or blastic lesions noted in the visualized portions of the
skeleton.
IMPRESSION: No significant incidental noncardiac findings are noted.

## 2022-10-12 ENCOUNTER — Other Ambulatory Visit (HOSPITAL_BASED_OUTPATIENT_CLINIC_OR_DEPARTMENT_OTHER): Payer: Self-pay

## 2022-10-12 MED ORDER — WEGOVY 2.4 MG/0.75ML ~~LOC~~ SOAJ
2.4000 mg | SUBCUTANEOUS | 1 refills | Status: DC
  Filled 2022-10-12: qty 3, 28d supply, fill #0

## 2022-11-13 ENCOUNTER — Encounter (HOSPITAL_BASED_OUTPATIENT_CLINIC_OR_DEPARTMENT_OTHER): Payer: Self-pay

## 2022-11-13 ENCOUNTER — Other Ambulatory Visit: Payer: Self-pay

## 2022-11-13 ENCOUNTER — Emergency Department (HOSPITAL_BASED_OUTPATIENT_CLINIC_OR_DEPARTMENT_OTHER): Payer: 59

## 2022-11-13 ENCOUNTER — Emergency Department (HOSPITAL_BASED_OUTPATIENT_CLINIC_OR_DEPARTMENT_OTHER)
Admission: EM | Admit: 2022-11-13 | Discharge: 2022-11-13 | Disposition: A | Discharge status: Home / Self Care | Payer: 59 | Attending: Emergency Medicine | Admitting: Emergency Medicine

## 2022-11-13 DIAGNOSIS — Z1152 Encounter for screening for COVID-19: Secondary | ICD-10-CM | POA: Insufficient documentation

## 2022-11-13 DIAGNOSIS — R070 Pain in throat: Secondary | ICD-10-CM | POA: Diagnosis present

## 2022-11-13 DIAGNOSIS — Z79899 Other long term (current) drug therapy: Secondary | ICD-10-CM | POA: Insufficient documentation

## 2022-11-13 DIAGNOSIS — I10 Essential (primary) hypertension: Secondary | ICD-10-CM | POA: Diagnosis not present

## 2022-11-13 DIAGNOSIS — J039 Acute tonsillitis, unspecified: Secondary | ICD-10-CM | POA: Diagnosis not present

## 2022-11-13 LAB — CBC WITH DIFFERENTIAL/PLATELET
Abs Immature Granulocytes: 0.08 10*3/uL — ABNORMAL HIGH (ref 0.00–0.07)
Basophils Absolute: 0 10*3/uL (ref 0.0–0.1)
Basophils Relative: 0 %
Eosinophils Absolute: 0.1 10*3/uL (ref 0.0–0.5)
Eosinophils Relative: 1 %
HCT: 43.4 % (ref 39.0–52.0)
Hemoglobin: 14 g/dL (ref 13.0–17.0)
Immature Granulocytes: 1 %
Lymphocytes Relative: 14 %
Lymphs Abs: 2.1 10*3/uL (ref 0.7–4.0)
MCH: 26 pg (ref 26.0–34.0)
MCHC: 32.3 g/dL (ref 30.0–36.0)
MCV: 80.5 fL (ref 80.0–100.0)
Monocytes Absolute: 1 10*3/uL (ref 0.1–1.0)
Monocytes Relative: 7 %
Neutro Abs: 11.6 10*3/uL — ABNORMAL HIGH (ref 1.7–7.7)
Neutrophils Relative %: 77 %
Platelets: 259 10*3/uL (ref 150–400)
RBC: 5.39 MIL/uL (ref 4.22–5.81)
RDW: 14.3 % (ref 11.5–15.5)
WBC: 14.9 10*3/uL — ABNORMAL HIGH (ref 4.0–10.5)
nRBC: 0 % (ref 0.0–0.2)

## 2022-11-13 LAB — BASIC METABOLIC PANEL
Anion gap: 8 (ref 5–15)
BUN: 13 mg/dL (ref 6–20)
CO2: 26 mmol/L (ref 22–32)
Calcium: 9.4 mg/dL (ref 8.9–10.3)
Chloride: 106 mmol/L (ref 98–111)
Creatinine, Ser: 1.05 mg/dL (ref 0.61–1.24)
GFR, Estimated: >60 mL/min (ref >60)
Glucose, Bld: 96 mg/dL (ref 70–99)
Potassium: 3.3 mmol/L — ABNORMAL LOW (ref 3.5–5.1)
Sodium: 140 mmol/L (ref 135–145)

## 2022-11-13 LAB — GROUP A STREP BY PCR: Group A Strep by PCR: NOT DETECTED

## 2022-11-13 LAB — SARS CORONAVIRUS 2 BY RT PCR: SARS Coronavirus 2 by RT PCR: NEGATIVE

## 2022-11-13 MED ORDER — ACETAMINOPHEN 325 MG PO TABS
650.0000 mg | ORAL_TABLET | Freq: Once | ORAL | Status: AC
  Administered 2022-11-13: 650 mg via ORAL
  Filled 2022-11-13: qty 2

## 2022-11-13 MED ORDER — LIDOCAINE VISCOUS HCL 2 % MT SOLN: 15.0000 mL | OROMUCOSAL | 0 refills | Status: DC | PRN

## 2022-11-13 MED ORDER — DEXAMETHASONE SODIUM PHOSPHATE 10 MG/ML IJ SOLN
10.0000 mg | Freq: Once | INTRAMUSCULAR | Status: AC
  Administered 2022-11-13: 10 mg via INTRAVENOUS
  Filled 2022-11-13: qty 1

## 2022-11-13 MED ORDER — DEXAMETHASONE 6 MG PO TABS: 6.0000 mg | ORAL_TABLET | Freq: Once | ORAL | 0 refills | Status: AC

## 2022-11-13 MED ORDER — KETOROLAC TROMETHAMINE 15 MG/ML IJ SOLN
15.0000 mg | Freq: Once | INTRAMUSCULAR | Status: AC
  Administered 2022-11-13: 15 mg via INTRAVENOUS
  Filled 2022-11-13: qty 1

## 2022-11-13 MED ORDER — IOHEXOL 300 MG/ML  SOLN
100.0000 mL | Freq: Once | INTRAMUSCULAR | Status: AC | PRN
  Administered 2022-11-13: 75 mL via INTRAVENOUS

## 2022-11-13 NOTE — ED Provider Notes (Signed)
[place] EMERGENCY DEPARTMENT AT [facility] Provider Note   CSN: [id number] Arrival date & time: 11/13/22  1503     History  Chief Complaint  Patient presents with   Sore Throat    Leroy Cochran is a 52 y.o. male.  Patient is a 52 year old male with a past medical history of hypertension presenting to the emergency department with sore throat.  The patient states that he has had left-sided ear and throat pain since Friday.  He states he has had associated fevers with some nausea but denies any vomiting.  He states that he is having difficulty swallowing both liquids and solids.  He states that he was seen at urgent care today who recommended that he come to the emergency department for further evaluation.  He states he had some mild associated congestion but denies any cough.  He denies any known sick contacts.  The history is provided by the patient.  Sore Throat       Home Medications Prior to Admission medications   Medication Sig Start Date End Date Taking? Authorizing Provider  dexamethasone (DECADRON) 6 MG tablet Take 1 tablet (6 mg total) by mouth once for 1 dose. Take this on Tuesday (~48 hours after your first dose in the ER) if you are still having throat pain. 11/13/22 11/13/22 Yes Kingsley, Benetta Spar K, DO  lidocaine (XYLOCAINE) 2 % solution Use as directed 15 mLs in the mouth or throat as needed for mouth pain. 11/13/22  Yes Theresia Lo, Turkey K, DO  carvedilol (COREG) 6.25 MG tablet Take 6.25 mg by mouth 2 (two) times daily. 12/03/21   [provider]  cetirizine (ZYRTEC) 10 MG tablet Take 10 mg by mouth daily as needed for allergies.    [provider]  fluticasone (FLONASE) 50 MCG/ACT nasal spray Place 1 spray into both nostrils daily as needed for allergies. 10/15/15   [provider]  Semaglutide-Weight Management (WEGOVY) 2.4 MG/0.75ML SOAJ Inject 2.4 mg into the skin once a week. 10/12/22     SYMBICORT 80-4.5 MCG/ACT inhaler  Inhale 1 puff into the lungs daily. 04/29/20   [provider]  TRIBENZOR 40-5-25 MG TABS Take 1 tablet by mouth daily. 03/30/20   [provider]      Allergies    Patient has no known allergies.    Review of Systems   Review of Systems  Physical Exam Updated Vital Signs BP (!) 137/90   Pulse 91   Temp 100.1 F (37.8 C) (Oral)   Resp 18   Ht 5\' 11"  (1.803 m)   Wt 114.8 kg   SpO2 98%   BMI 35.29 kg/m  Physical Exam Vitals and nursing note reviewed.  Constitutional:      General: He is not in acute distress.    Appearance: He is well-developed.  HENT:     Head: Normocephalic and atraumatic.     Right Ear: Tympanic membrane and ear canal normal.     Left Ear: Tympanic membrane and ear canal normal.     Nose: Congestion present.     Mouth/Throat:     Mouth: Mucous membranes are moist.     Pharynx: Uvula midline. Posterior oropharyngeal erythema present. No oropharyngeal exudate or uvula swelling.     Tonsils: No tonsillar exudate. 1+ on the right. 2+ on the left.     Comments: No trismus, tolerating secretions Eyes:     Conjunctiva/sclera: Conjunctivae normal.  Cardiovascular:     Rate and Rhythm: Normal rate  and regular rhythm.     Heart sounds: Normal heart sounds.  Pulmonary:     Effort: Pulmonary effort is normal.     Breath sounds: Normal breath sounds. No stridor.  Abdominal:     Palpations: Abdomen is soft.  Musculoskeletal:     Cervical back: Normal range of motion and neck supple.  Lymphadenopathy:     Cervical: Cervical adenopathy present.  Skin:    General: Skin is warm and dry.  Neurological:     General: No focal deficit present.     Mental Status: He is alert and oriented to person, place, and time.  Psychiatric:        Mood and Affect: Mood normal.        Behavior: Behavior normal.     ED Results / Procedures / Treatments   Labs (all labs ordered are listed, but only abnormal results are displayed) Labs Reviewed  BASIC  METABOLIC PANEL - Abnormal; Notable for the following components:      Result Value   Potassium 3.3 (*)    All other components within normal limits  CBC WITH DIFFERENTIAL/PLATELET - Abnormal; Notable for the following components:   WBC 14.9 (*)    Neutro Abs 11.6 (*)    Abs Immature Granulocytes 0.08 (*)    All other components within normal limits  GROUP A STREP BY PCR  SARS CORONAVIRUS 2 BY RT PCR    EKG EKG Interpretation  Date/Time:  Sunday November 13 2022 16:12:13 EDT Ventricular Rate:  85 PR Interval:  156 QRS Duration: 92 QT Interval:  333 QTC Calculation: 396 R Axis:   10 Text Interpretation: Sinus rhythm Probable left atrial enlargement Abnormal R-wave progression, early transition Nonspecific T abnormalities, diffuse leads Baseline wander No significant change since last tracing Confirmed by Elayne Snare ([id number]) on 11/13/2022 4:19:02 PM  Radiology CT Soft Tissue Neck W Contrast  Result Date: 11/13/2022 CLINICAL DATA:  Epiglottitis or tonsillitis suspected EXAM: CT NECK WITH CONTRAST TECHNIQUE: Multidetector CT imaging of the neck was performed using the standard protocol following the bolus administration of intravenous contrast. RADIATION DOSE REDUCTION: This exam was performed according to the departmental dose-optimization program which includes automated exposure control, adjustment of the mA and/or kV according to patient size and/or use of iterative reconstruction technique. CONTRAST:  75mL OMNIPAQUE IOHEXOL 300 MG/ML  SOLN COMPARISON:  None Available. FINDINGS: Pharynx and larynx: The epiglottis is normal in appearance. There is asymmetric enlargement of the left-sided palatine tonsil (series 2, image 46). The left parapharyngeal space is normal in appearance. There is no evidence of an intra tonsillar fluid collection. Salivary glands: No inflammation, mass, or stone. Thyroid: Normal. Lymph nodes: None enlarged or abnormal density. Vascular: Negative. Limited  intracranial: Negative. Visualized orbits: Negative. Mastoids and visualized paranasal sinuses: Trace right mastoid effusion. No middle ear effusion. No left mastoid effusion. Paranasal sinuses are clear. Orbits are unremarkable. Skeleton: No acute or aggressive process. Upper chest: Negative. Other: None. IMPRESSION: Asymmetric enlargement of the left-sided palatine tonsil, which is nonspecific but can be seen in the setting of tonsillitis. Recommend correlation with direct visualization to exclude the possibility of an underlying mass. Electronically Signed   By: Lorenza Cambridge M.D.   On: 11/13/2022 17:17    Procedures Procedures    Medications Ordered in ED Medications  ketorolac (TORADOL) 15 MG/ML injection 15 mg (15 mg Intravenous Given 11/13/22 1613)  dexamethasone (DECADRON) injection 10 mg (10 mg Intravenous Given 11/13/22 1609)  acetaminophen (TYLENOL) tablet 650 mg (  650 mg Oral Given 11/13/22 1617)  iohexol (OMNIPAQUE) 300 MG/ML solution 100 mL (75 mLs Intravenous Contrast Given 11/13/22 1653)    ED Course/ Medical Decision Making/ A&P Clinical Course as of 11/13/22 1736  Sun Nov 13, 2022  1724 Tonsillitis without abscess on CT. Will treat symptomatically and recommend ENT follow up to ensure swelling resolves without signs of underlying mass. [VK]  1733 Upon reassessment, the patient's symptoms have improved.  He is stable for discharge home with outpatient follow-up and was given strict return precautions. [VK]    Clinical Course User Index [VK] Rexford Maus, DO                             Medical Decision Making This patient presents to the ED with chief complaint(s) of sore throat with pertinent past medical history of hypertension which further complicates the presenting complaint. The complaint involves an extensive differential diagnosis and also carries with it a high risk of complications and morbidity.    The differential diagnosis includes pharyngitis, tonsillitis,  considering PTA with left-sided tonsillar swelling, no submandibular swelling making Ludwick's unlikely, considering RPA, viral syndrome  Additional history obtained: Additional history obtained from family Records reviewed urgent care records  ED Course and Reassessment: On patient's arrival he is hemodynamically stable in no acute distress.  He does have asymmetrical tonsillar swelling concerning for possible abscess.  The patient will have CT neck to evaluate for abscess.  He will be treated symptomatically with Toradol and Decadron will be closely reassessed.  Independent labs interpretation:  The following labs were independently interpreted: mild leukocytosis, otherwise within normal range  Independent visualization of imaging: - I independently visualized the following imaging with scope of interpretation limited to determining acute life threatening conditions related to emergency care: CT neck, which revealed tonsillitis without abscess  Consultation: - Consulted or discussed management/test interpretation w/ external professional: N/A  Consideration for admission or further workup: Patient has no emergent conditions requiring admission or further work-up at this time and is stable for discharge home with primary care/ENT follow-up  Social Determinants of health: N/A    Amount and/or Complexity of Data Reviewed Labs: ordered. Radiology: ordered.  Risk OTC drugs. Prescription drug management.          Final Clinical Impression(s) / ED Diagnoses Final diagnoses:  Tonsillitis    Rx / DC Orders ED Discharge Orders          Ordered    dexamethasone (DECADRON) 6 MG tablet   Once        11/13/22 1734    lidocaine (XYLOCAINE) 2 % solution  As needed        11/13/22 1734              Rexford Maus, DO 11/13/22 1736

## 2022-11-13 NOTE — ED Triage Notes (Signed)
Patient here POV from Home.  Endorses Left Sided Otalgia that began 2.5 Days ago. Soon after began to have Sore Throat, Subjective Fever, and Aches.  Sent by [facility] for Assessment.  NAD Noted during Triage. A&Ox4. Gcs 15. Ambulatory.

## 2022-11-13 NOTE — Discharge Instructions (Addendum)
You were seen in the emergency department for your throat pain.  Your workup shows that you have tonsillitis and you had no signs of abscess or bacterial infection and you tested negative for COVID and strep.  This is likely caused by a viral infection and you can continue symptomatic treatment with Tylenol and Motrin as needed for pain.  Motrin tends to help better with throat type of pain.  You can also use throat lozenges, lidocaine and I have given you an extra dose of a steroid to take 48 hours from your first dose if you are still having pain.  You can follow-up with your primary doctor and with ENT to have your symptoms rechecked and make sure that your tonsil swelling resolves.  You should return to the emergency department for having significantly worsening pain, you are unable to swallow your saliva, you are unable to open your mouth, you have a muffled voice or if you have any other new or concerning symptoms.

## 2022-11-13 NOTE — ED Notes (Signed)
Dc instructions reviewed with patient. Patient voiced understanding. Dc with belongings.  °

## 2023-02-12 ENCOUNTER — Observation Stay (HOSPITAL_COMMUNITY)
Admission: EM | Admit: 2023-02-12 | Discharge: 2023-02-13 | Disposition: A | Discharge status: Home / Self Care | Payer: 59 | Attending: Internal Medicine | Admitting: Internal Medicine

## 2023-02-12 ENCOUNTER — Encounter (HOSPITAL_COMMUNITY): Payer: Self-pay

## 2023-02-12 ENCOUNTER — Emergency Department (HOSPITAL_COMMUNITY): Payer: 59

## 2023-02-12 ENCOUNTER — Other Ambulatory Visit: Payer: Self-pay

## 2023-02-12 DIAGNOSIS — D500 Iron deficiency anemia secondary to blood loss (chronic): Secondary | ICD-10-CM | POA: Diagnosis not present

## 2023-02-12 DIAGNOSIS — K625 Hemorrhage of anus and rectum: Principal | ICD-10-CM | POA: Insufficient documentation

## 2023-02-12 DIAGNOSIS — I10 Essential (primary) hypertension: Secondary | ICD-10-CM | POA: Insufficient documentation

## 2023-02-12 DIAGNOSIS — Z9889 Other specified postprocedural states: Secondary | ICD-10-CM | POA: Diagnosis not present

## 2023-02-12 DIAGNOSIS — M109 Gout, unspecified: Secondary | ICD-10-CM | POA: Diagnosis not present

## 2023-02-12 LAB — COMPREHENSIVE METABOLIC PANEL
ALT: 19 U/L (ref 0–44)
AST: 16 U/L (ref 15–41)
Albumin: 3.3 g/dL — ABNORMAL LOW (ref 3.5–5.0)
Alkaline Phosphatase: 56 U/L (ref 38–126)
Anion gap: 9 (ref 5–15)
BUN: 18 mg/dL (ref 6–20)
CO2: 22 mmol/L (ref 22–32)
Calcium: 8.6 mg/dL — ABNORMAL LOW (ref 8.9–10.3)
Chloride: 107 mmol/L (ref 98–111)
Creatinine, Ser: 1.15 mg/dL (ref 0.61–1.24)
GFR, Estimated: >60 mL/min (ref >60)
Glucose, Bld: 161 mg/dL — ABNORMAL HIGH (ref 70–99)
Potassium: 3.5 mmol/L (ref 3.5–5.1)
Sodium: 138 mmol/L (ref 135–145)
Total Bilirubin: 0.6 mg/dL (ref 0.3–1.2)
Total Protein: 6.4 g/dL — ABNORMAL LOW (ref 6.5–8.1)

## 2023-02-12 LAB — CBC
HCT: 36.8 % — ABNORMAL LOW (ref 39.0–52.0)
HCT: 33.9 % — ABNORMAL LOW (ref 39.0–52.0)
Hemoglobin: 11.2 g/dL — ABNORMAL LOW (ref 13.0–17.0)
Hemoglobin: 10.6 g/dL — ABNORMAL LOW (ref 13.0–17.0)
MCH: 25.2 pg — ABNORMAL LOW (ref 26.0–34.0)
MCH: 26.3 pg (ref 26.0–34.0)
MCHC: 30.4 g/dL (ref 30.0–36.0)
MCHC: 31.3 g/dL (ref 30.0–36.0)
MCV: 82.7 fL (ref 80.0–100.0)
MCV: 84.1 fL (ref 80.0–100.0)
Platelets: 265 10*3/uL (ref 150–400)
Platelets: 210 10*3/uL (ref 150–400)
RBC: 4.45 MIL/uL (ref 4.22–5.81)
RBC: 4.03 MIL/uL — ABNORMAL LOW (ref 4.22–5.81)
RDW: 14.3 % (ref 11.5–15.5)
RDW: 14.3 % (ref 11.5–15.5)
WBC: 12.9 10*3/uL — ABNORMAL HIGH (ref 4.0–10.5)
WBC: 11.2 10*3/uL — ABNORMAL HIGH (ref 4.0–10.5)
nRBC: 0 % (ref 0.0–0.2)
nRBC: 0 % (ref 0.0–0.2)

## 2023-02-12 LAB — TYPE AND SCREEN
ABO/RH(D): O POS
Antibody Screen: NEGATIVE

## 2023-02-12 LAB — IRON AND TIBC
Iron: 22 ug/dL — ABNORMAL LOW (ref 45–182)
Saturation Ratios: 7 % — ABNORMAL LOW (ref 17.9–39.5)
TIBC: 301 ug/dL (ref 250–450)
UIBC: 279 ug/dL

## 2023-02-12 LAB — HIV ANTIBODY (ROUTINE TESTING W REFLEX): HIV Screen 4th Generation wRfx: NONREACTIVE

## 2023-02-12 LAB — LACTIC ACID, PLASMA
Lactic Acid, Venous: 1.1 mmol/L (ref 0.5–1.9)
Lactic Acid, Venous: 0.9 mmol/L (ref 0.5–1.9)

## 2023-02-12 LAB — FERRITIN: Ferritin: 51 ng/mL (ref 24–336)

## 2023-02-12 MED ORDER — ONDANSETRON HCL 4 MG/2ML IJ SOLN
4.0000 mg | Freq: Once | INTRAMUSCULAR | Status: AC
  Administered 2023-02-12: 4 mg via INTRAVENOUS
  Filled 2023-02-12: qty 2

## 2023-02-12 MED ORDER — PANTOPRAZOLE SODIUM 40 MG PO TBEC
40.0000 mg | DELAYED_RELEASE_TABLET | Freq: Every day | ORAL | Status: DC
  Administered 2023-02-12 – 2023-02-13 (×2): 40 mg via ORAL
  Filled 2023-02-12 (×2): qty 1

## 2023-02-12 NOTE — ED Notes (Signed)
ED TO INPATIENT HANDOFF REPORT  ED Nurse Name and Phone #:  Tresa Endo  [phone]  S Name/Age/Gender Leroy Cochran 52 y.o. male Room/Bed: 033C/033C  Code Status   Code Status: Full Code  Home/SNF/Other Home Patient oriented to: self, place, time, and situation Is this baseline? Yes   Triage Complete: Triage complete  Chief Complaint Rectal bleeding [K62.5]  Triage Note Pt c/o bright red blood in toilet four times since last night. Pt c/o weakness. Pt states had a colonoscopy on Mon or Tues and they removed 7 polyps and pt states one was large. Pt states had only one bloody stool after the colonoscopy, but got worse and increased yesterday and today    Allergies No Known Allergies  Level of Care/Admitting Diagnosis ED Disposition     ED Disposition  Admit   Condition  --   Comment  Hospital Area: [facility] [100100]  Level of Care: Med-Surg [16]  May place patient in observation at [facility] or [facility] if equivalent level of care is available:: No  Covid Evaluation: Asymptomatic - no recent exposure (last 10 days) testing not required  Diagnosis: Rectal bleeding [217577]  Admitting Physician: Tyson Alias [[id number]]  Attending Physician: Tyson Alias [phone]          B Medical/Surgery History Past Medical History:  Diagnosis Date   Essential hypertension    Hyperlipidemia    Insomnia    OSA on CPAP    Dr.Turner;; CPAP -1, (RDI=82.7, Low O2 - 78%)   Past Surgical History:  Procedure Laterality Date   CPX - CARDIOPULMONARY EXERCISE TEST  04/17/2017    Limited due to submaximal exercise (did not reach target heart rate).  Response.  Some shortness of breath related to abnormal relaxation.  No cardiopulmonary limitations.  Limitations more related to deconditioning and body habitus.   WISDOM TOOTH EXTRACTION       A IV Location/Drains/Wounds Patient Lines/Drains/Airways Status     Active Line/Drains/Airways      Name Placement date Placement time Site Days   Peripheral IV 02/12/23 20 G Right Antecubital 02/12/23  0835  Antecubital  less than 1            Intake/Output Last 24 hours No intake or output data in the 24 hours ending 02/12/23 1238  Labs/Imaging Results for orders placed or performed during the hospital encounter of 02/12/23 (from the past 48 hour(s))  Comprehensive metabolic panel     Status: Abnormal   Collection Time: 02/12/23  7:23 AM  Result Value Ref Range   Sodium 138 135 - 145 mmol/L   Potassium 3.5 3.5 - 5.1 mmol/L   Chloride 107 98 - 111 mmol/L   CO2 22 22 - 32 mmol/L   Glucose, Bld 161 (H) 70 - 99 mg/dL    Comment: Glucose reference range applies only to samples taken after fasting for at least 8 hours.   BUN 18 6 - 20 mg/dL   Creatinine, Ser 3.47 0.61 - 1.24 mg/dL   Calcium 8.6 (L) 8.9 - 10.3 mg/dL   Total Protein 6.4 (L) 6.5 - 8.1 g/dL   Albumin 3.3 (L) 3.5 - 5.0 g/dL   AST 16 15 - 41 U/L   ALT 19 0 - 44 U/L   Alkaline Phosphatase 56 38 - 126 U/L   Total Bilirubin 0.6 0.3 - 1.2 mg/dL   GFR, Estimated >42 >59 mL/min    Comment: (NOTE) Calculated using the CKD-EPI Creatinine Equation (2021)  Anion gap 9 5 - 15    Comment: Performed at [facility], [address], [place], [place] [address]  CBC     Status: Abnormal   Collection Time: 02/12/23  7:23 AM  Result Value Ref Range   WBC 12.9 (H) 4.0 - 10.5 K/uL   RBC 4.45 4.22 - 5.81 MIL/uL   Hemoglobin 11.2 (L) 13.0 - 17.0 g/dL   HCT 09.8 (L) 11.9 - 14.7 %   MCV 82.7 80.0 - 100.0 fL   MCH 25.2 (L) 26.0 - 34.0 pg   MCHC 30.4 30.0 - 36.0 g/dL   RDW 82.9 56.2 - 13.0 %   Platelets 265 150 - 400 K/uL   nRBC 0.0 0.0 - 0.2 %    Comment: Performed at [facility], [address], [place], [place] [address]  Lactic acid, plasma     Status: None   Collection Time: 02/12/23  7:35 AM  Result Value Ref Range   Lactic Acid, Venous 1.1 0.5 - 1.9 mmol/L    Comment: Performed at [facility]  Lab, [address], [place], [place] [address]  Type and screen [facility]     Status: None   Collection Time: 02/12/23  7:40 AM  Result Value Ref Range   ABO/RH(D) O POS    Antibody Screen NEG    Sample Expiration      02/15/2023,2359 Performed at [facility], [address] [place], [place] [address]   Lactic acid, plasma     Status: None   Collection Time: 02/12/23 10:05 AM  Result Value Ref Range   Lactic Acid, Venous 0.9 0.5 - 1.9 mmol/L    Comment: Performed at [facility], [address], [place], [place] [address]   DG Chest 2 View  Result Date: 02/12/2023 CLINICAL DATA:  Blood in the stool status post colonoscopy. EXAM: CHEST - 2 VIEW COMPARISON:  December 15, 2021 FINDINGS: The cardiomediastinal silhouette is normal in contour. No pleural effusion. No pneumothorax. No acute pleuroparenchymal abnormality. Visualized abdomen is unremarkable. No acute osseous abnormality noted. IMPRESSION: No acute cardiopulmonary abnormality. No radiographically evident free air. Electronically Signed   By: Meda Klinefelter M.D.   On: 02/12/2023 09:52    Pending Labs Unresulted Labs (From admission, onward)     Start     Ordered   02/13/23 0500  CBC  Tomorrow morning,   R        02/12/23 1140   02/12/23 1500  CBC  Once-Timed,   TIMED        02/12/23 1059   02/12/23 1059  Iron and TIBC  Once,   R        02/12/23 1059   02/12/23 1059  Ferritin  Once,   R        02/12/23 1059   02/12/23 1057  HIV Antibody (routine testing w rflx)  (HIV Antibody (Routine testing w reflex) panel)  Once,   R        02/12/23 1059            Vitals/Pain Today's Vitals   02/12/23 0720 02/12/23 0721 02/12/23 1118  BP:  136/74   Pulse:  81   Resp:  17   Temp:  98 F (36.7 C) 97.6 F (36.4 C)  TempSrc:  Oral Oral  SpO2:  100%   Weight: 114.8 kg    Height: 5\' 11"  (1.803 m)    PainSc: 0-No pain  Isolation Precautions No active isolations  Medications Medications   pantoprazole (PROTONIX) EC tablet 40 mg (40 mg Oral Given 02/12/23 1234)  ondansetron (ZOFRAN) injection 4 mg (4 mg Intravenous Given 02/12/23 0835)    Mobility walks     Focused Assessments   R Recommendations: See Admitting Provider Note  Report given to:   Additional Notes:  Patient  is alert oriented , very pleasant gentleman.

## 2023-02-12 NOTE — Progress Notes (Deleted)
Date: 02/12/2023               Patient Name:  Leroy Cochran MRN: [id number]  DOB: 11-Nov-1970 Age / Sex: 51 y.o., male   PCP: Mila Palmer, MD         Medical Service: Internal Medicine Teaching Service         Attending Physician: Dr. Oswaldo Done, Marquita Palms, *      First Contact: Dr. Annett Fabian, MD Pager [phone]    Second Contact: Dr. Rana Snare, DO Pager [phone]         After Hours (After 5p/  First Contact Pager: [phone]  weekends / holidays): Second Contact Pager: [phone]   SUBJECTIVE   Chief Complaint:  Chief Complaint  Patient presents with   Rectal Bleeding   History of Present Illness:   Leroy Cochran is a 52 y.o. with a PMH of HTN presenting with complaints of rectal bleeding. He got a colonoscopy on Tuesday 9/17 where he got several polyps removed including a large pedunculated one. He endorses having 2 episodes of bright red blood while having a bowel movement (not after) on Wednesday. The stools would be loose, non-foul smelling (except that they would smell like hematochezia), and not float. After the second episode the bleeding subsided until yesterday evening. He states that he had eaten a "super spicy" meal that day. He had 4 episodes of hematochezia and then started feeling generally weak, requiring to lay down on the floor for a moment. Has had some nausea and mild abdominal pain but denies any vomiting or sick contacts. Denies any fevers or chills. Denies lightheadedness, SOB, chest pain or palpitations. He denies this ever happening to him, this is the first colonoscopy he has gotten. He is not on blood thinners, does NOT have a history of thrombocytopenia, hepatic disease, or bleeding disorder. Denies a FH of colon cancer. Has been passing gas with no problem. Of note, he has been very worried about the results, and has a history of insomnia. He has been unable to sleep much this week thinking about this and the running thoughts in his  head.  Follows with [facility].   At the ED:   He had a Mild LLQ pain no rebound tenderness or guarding. No acute abdomen.   Hgb 11.2 down from 14 three months ago, HCT at 36.8, and an MCH of 25.2. Normocytic at 87. Iron labs were consistent with iron deficiency at 22 with a Ferritin of 51 and saturation at 7%   WBC was increased to 12  (likely reactive) lactic acid was 1.1, afebrile, with normal work of breathing and normotensive.   No signs of peritonitis on CXR. He was admitted for observation given his drop in hemoglobin and further work-up for potential active bleed.   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  Response.  Some shortness of breath related to abnormal relaxation.  No cardiopulmonary limitations.  Limitations more related to deconditioning and body habitus.   WISDOM TOOTH EXTRACTION     Meds:  Current Meds  Medication Sig   carvedilol (COREG) 6.25 MG  tablet Take 6.25 mg by mouth 2 (two) times daily.   cetirizine (ZYRTEC) 10 MG tablet Take 10 mg by mouth daily as needed for allergies.   fluticasone (FLONASE) 50 MCG/ACT nasal spray Place 2 sprays into both nostrils as needed for allergies.   SYMBICORT 80-4.5 MCG/ACT inhaler Inhale 2-3 puffs into the lungs as needed (wheezing/SOB).   TRIBENZOR 40-5-25 MG TABS Take 1 tablet by mouth daily.   No Known Allergies  Family History:   Family History  Problem Relation Age of Onset   Healthy Father    Cervical cancer Mother    Healthy Sister    Heart attack Maternal Grandfather        In his 63s   Healthy Daughter     Social:  Lives With: alone with daughter. Has a significant other called  French Ana who is an Charity fundraiser. She is her emergency contact. Occupation: Policeman Level of Function: Independent in all ADLs and iADLs PCP: Mila Palmer, MD Substances: Denies tobacco use ever, drinks socially occassionally, denies any recreational drug use.  Would like to be FULL Code.  Review of Systems: A complete ROS was negative except as per HPI.   OBJECTIVE:   Physical Exam: Blood pressure 111/75, pulse 61, temperature 97.9 F (36.6 C), temperature source Oral, resp. rate 18, height 5\' 11"  (1.803 m), weight 114.8 kg, SpO2 100%.  Constitutional: well-appearing laying in bed, in no acute distress HENT: normocephalic atraumatic, mucous membranes moist Eyes: conjunctiva non-erythematous, non-jaundiced. Neck: supple Cardiovascular: regular rate and rhythm, no m/r/g Pulmonary/Chest: normal work of breathing on room air, lungs clear to auscultation bilaterally Abdominal: Bowel sounds present, soft, non-distended. Tender to palpation mildly on the LLQ.  MSK: normal bulk and tone Neurological: alert & oriented x 3, 5/5 strength in bilateral upper and lower extremities Skin: warm and dry  Labs: CBC    Component Value Date/Time   WBC 12.9 (H) 02/12/2023 0723   RBC 4.45 02/12/2023 0723   HGB 11.2 (L) 02/12/2023 0723   HCT 36.8 (L) 02/12/2023 0723   PLT 265 02/12/2023 0723   MCV 82.7 02/12/2023 0723   MCH 25.2 (L) 02/12/2023 0723   MCHC 30.4 02/12/2023 0723   RDW 14.3 02/12/2023 0723   LYMPHSABS 2.1 11/13/2022 1540   MONOABS 1.0 11/13/2022 1540   EOSABS 0.1 11/13/2022 1540   BASOSABS 0.0 11/13/2022 1540     CMP     Component Value Date/Time   NA 138 02/12/2023 0723   K 3.5 02/12/2023 0723   CL 107 02/12/2023 0723   CO2 22 02/12/2023 0723   GLUCOSE 161 (H) 02/12/2023 0723   BUN 18 02/12/2023 0723   CREATININE 1.15 02/12/2023 0723   CALCIUM 8.6 (L) 02/12/2023 0723   PROT 6.4 (L) 02/12/2023 0723   ALBUMIN 3.3 (L) 02/12/2023 0723   AST 16 02/12/2023 0723   ALT 19 02/12/2023  0723   ALKPHOS 56 02/12/2023 0723   BILITOT 0.6 02/12/2023 0723   GFRNONAA >60 02/12/2023 0723    Imaging:  CXR No acute cardiopulmonary abnormality. No radiographically evident free air.  ASSESSMENT & PLAN:   Assessment & Plan by Problem: Principal Problem:   Rectal bleeding  Leroy Cochran is a 52 y.o. who presents with recurrent hematochezia after a colonoscopy on Tuesday, currently admitted for observation.   #Rectal Bleeding  S/p colonoscopy of Tuesday and resection of multiple polyps, including a large sized one. No signs of perforation on CXR, no acute abdomen on exam. Has  acute decline on  Hgb from 14 to 11 today. Reporting rebleeding after initial bleed from colonoscopy subsided on Wednesday. Some bleeding is expected after a colonoscopy, however the 4 bleeding episodes on Saturday night are worrisome for a rebleed.This could have been exacerbated after the very spicy meal he had. Does not sound infectious, patient is afebrile. Does not sound autoimmune, given age and presenting factors.  -Monitor H/H this afternoon  -cw pantoprazole 40mg  daily  -Appreciate GI, if continued bleeding may need repeat colonoscopy with clip placement.  -Full liquid diet  -CBC in the AM  #Iron Deficiency Anemia Iron labs were consistent with iron deficiency at 22 with a Ferritin of 51 and saturation at 7%. Will need 719 mg based on Ganzoni equation. Consider iron repletion outpatient.   #OSA  -cw Cpap   #Insomnia  -Can consider melatonin if patient is interested.  #Hypertension  Well controlled at home with coreg 6.25mg  BID and tibenzor 40-5-25mg  daily  -will hold for now in setting of potential acute bleed.  #Gout  Last episode last week. Not on any treatment (last drank dark cherry juice). Monitor for signs and symptoms. Consider allopurinol outpatient.   Diet: Full liquid VTE: SCDs IVF: None Code: Full  Prior to Admission Living Arrangement: Home Anticipated Discharge  Location: Home Barriers to Discharge: Medical workup   Dispo: Admit patient to Observation with expected length of stay less than 2 midnights.  Signed: [facility]  Internal Medicine Resident, PGY-1 [facility]  Pager: [phone]  02/12/2023, 2:36 PM

## 2023-02-12 NOTE — H&P (Cosign Needed Addendum)
Date: 02/12/2023               Patient Name:  Leroy Cochran MRN: [id number]  DOB: 11-Nov-1970 Age / Sex: 52 y.o., male   PCP: Mila Palmer, MD         Medical Service: Internal Medicine Teaching Service         Attending Physician: Dr. Oswaldo Done, Marquita Palms, *      First Contact: Dr. Annett Fabian, MD Pager [phone]    Second Contact: Dr. Rana Snare, DO Pager [phone]         After Hours (After 5p/  First Contact Pager: [phone]  weekends / holidays): Second Contact Pager: [phone]   SUBJECTIVE   Chief Complaint:  Chief Complaint  Patient presents with   Rectal Bleeding   History of Present Illness:   Leroy Cochran is a 52 y.o. with a PMH of HTN presenting with complaints of rectal bleeding. He got a colonoscopy on Tuesday 9/17 where he got several polyps removed including a large pedunculated one. He endorses having 2 episodes of bright red blood while having a bowel movement (not after) on Wednesday. The stools would be loose, non-foul smelling (except that they would smell like hematochezia), and not float. After the second episode the bleeding subsided until yesterday evening. He states that he had eaten a "super spicy" meal that day. He had 4 episodes of hematochezia and then started feeling generally weak, requiring to lay down on the floor for a moment. Has had some nausea and mild abdominal pain but denies any vomiting or sick contacts. Denies any fevers or chills. Denies lightheadedness, SOB, chest pain or palpitations. He denies this ever happening to him, this is the first colonoscopy he has gotten. He is not on blood thinners, does NOT have a history of thrombocytopenia, hepatic disease, or bleeding disorder. Denies a FH of colon cancer. Has been passing gas with no problem. Of note, he has been very worried about the results, and has a history of insomnia. He has been unable to sleep much this week thinking about this and the running thoughts in his  head.  Follows with [facility].   At the ED:   He had a Mild LLQ pain no rebound tenderness or guarding. No acute abdomen.   Hgb 11.2 down from 14 three months ago, HCT at 36.8, and an MCH of 25.2. Normocytic at 87. Iron labs were consistent with iron deficiency at 22 with a Ferritin of 51 and saturation at 7%   WBC was increased to 12  (likely reactive) lactic acid was 1.1, afebrile, with normal work of breathing and normotensive.   No signs of peritonitis on CXR. He was admitted for observation given his drop in hemoglobin and further work-up for potential active bleed.   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  Response.  Some shortness of breath related to abnormal relaxation.  No cardiopulmonary limitations.  Limitations more related to deconditioning and body habitus.   WISDOM TOOTH EXTRACTION     Meds:  Current Meds  Medication Sig   carvedilol (COREG) 6.25 MG  tablet Take 6.25 mg by mouth 2 (two) times daily.   cetirizine (ZYRTEC) 10 MG tablet Take 10 mg by mouth daily as needed for allergies.   fluticasone (FLONASE) 50 MCG/ACT nasal spray Place 2 sprays into both nostrils as needed for allergies.   SYMBICORT 80-4.5 MCG/ACT inhaler Inhale 2-3 puffs into the lungs as needed (wheezing/SOB).   TRIBENZOR 40-5-25 MG TABS Take 1 tablet by mouth daily.   No Known Allergies  Family History:   Family History  Problem Relation Age of Onset   Healthy Father    Cervical cancer Mother    Healthy Sister    Heart attack Maternal Grandfather        In his 63s   Healthy Daughter     Social:  Lives With: alone with daughter. Has a significant other called  French Ana who is an Charity fundraiser. She is her emergency contact. Occupation: Policeman Level of Function: Independent in all ADLs and iADLs PCP: Mila Palmer, MD Substances: Denies tobacco use ever, drinks socially occassionally, denies any recreational drug use.  Would like to be FULL Code.  Review of Systems: A complete ROS was negative except as per HPI.   OBJECTIVE:   Physical Exam: Blood pressure 111/75, pulse 61, temperature 97.9 F (36.6 C), temperature source Oral, resp. rate 18, height 5\' 11"  (1.803 m), weight 114.8 kg, SpO2 100%.  Constitutional: well-appearing laying in bed, in no acute distress HENT: normocephalic atraumatic, mucous membranes moist Eyes: conjunctiva non-erythematous, non-jaundiced. Neck: supple Cardiovascular: regular rate and rhythm, no m/r/g Pulmonary/Chest: normal work of breathing on room air, lungs clear to auscultation bilaterally Abdominal: Bowel sounds present, soft, non-distended. Tender to palpation mildly on the LLQ.  MSK: normal bulk and tone Neurological: alert & oriented x 3, 5/5 strength in bilateral upper and lower extremities Skin: warm and dry  Labs: CBC    Component Value Date/Time   WBC 12.9 (H) 02/12/2023 0723   RBC 4.45 02/12/2023 0723   HGB 11.2 (L) 02/12/2023 0723   HCT 36.8 (L) 02/12/2023 0723   PLT 265 02/12/2023 0723   MCV 82.7 02/12/2023 0723   MCH 25.2 (L) 02/12/2023 0723   MCHC 30.4 02/12/2023 0723   RDW 14.3 02/12/2023 0723   LYMPHSABS 2.1 11/13/2022 1540   MONOABS 1.0 11/13/2022 1540   EOSABS 0.1 11/13/2022 1540   BASOSABS 0.0 11/13/2022 1540     CMP     Component Value Date/Time   NA 138 02/12/2023 0723   K 3.5 02/12/2023 0723   CL 107 02/12/2023 0723   CO2 22 02/12/2023 0723   GLUCOSE 161 (H) 02/12/2023 0723   BUN 18 02/12/2023 0723   CREATININE 1.15 02/12/2023 0723   CALCIUM 8.6 (L) 02/12/2023 0723   PROT 6.4 (L) 02/12/2023 0723   ALBUMIN 3.3 (L) 02/12/2023 0723   AST 16 02/12/2023 0723   ALT 19 02/12/2023  0723   ALKPHOS 56 02/12/2023 0723   BILITOT 0.6 02/12/2023 0723   GFRNONAA >60 02/12/2023 0723    Imaging:  CXR No acute cardiopulmonary abnormality. No radiographically evident free air.  ASSESSMENT & PLAN:   Assessment & Plan by Problem: Principal Problem:   Rectal bleeding  EVANN AZUARA is a 52 y.o. who presents with recurrent hematochezia after a colonoscopy on Tuesday, currently admitted for observation.   #Rectal Bleeding  S/p colonoscopy of Tuesday and resection of multiple polyps, including a large sized one. No signs of perforation on CXR, no acute abdomen on exam. Has  acute decline on  Hgb from 14 to 11 today. Reporting rebleeding after initial bleed from colonoscopy subsided on Wednesday. Some bleeding is expected after a colonoscopy, however the 4 bleeding episodes on Saturday night are worrisome for a rebleed.This could have been exacerbated after the very spicy meal he had. Does not sound infectious, patient is afebrile. Does not sound autoimmune, given age and presenting factors.  -Monitor H/H this afternoon  -cw pantoprazole 40mg  daily  -Appreciate GI, if continued bleeding may need repeat colonoscopy with clip placement.  -Full liquid diet  -CBC in the AM  #Iron Deficiency Anemia Iron labs were consistent with iron deficiency at 22 with a Ferritin of 51 and saturation at 7%. Will need 719 mg based on Ganzoni equation. Consider iron repletion outpatient.   #OSA  -cw Cpap   #Insomnia  -Can consider melatonin if patient is interested.  #Hypertension  Well controlled at home with coreg 6.25mg  BID and tibenzor 40-5-25mg  daily  -will hold for now in setting of potential acute bleed.  #Gout  Last episode last week. Not on any treatment (last drank dark cherry juice). Monitor for signs and symptoms. Consider allopurinol outpatient.   Diet: Full liquid VTE: SCDs IVF: None Code: Full  Prior to Admission Living Arrangement: Home Anticipated Discharge  Location: Home Barriers to Discharge: Medical workup   Dispo: Admit patient to Observation with expected length of stay less than 2 midnights.  Signed: [facility]  Internal Medicine Resident, PGY-1 [facility]  Pager: [phone]  02/12/2023, 2:36 PM

## 2023-02-12 NOTE — Progress Notes (Signed)
Placed patient on CPAP for the night via auto-mode.

## 2023-02-12 NOTE — ED Notes (Signed)
Diet tray ordered. Full Liquid.

## 2023-02-12 NOTE — ED Triage Notes (Addendum)
Pt c/o bright red blood in toilet four times since last night. Pt c/o weakness. Pt states had a colonoscopy on Mon or Tues and they removed 7 polyps and pt states one was large. Pt states had only one bloody stool after the colonoscopy, but got worse and increased yesterday and today

## 2023-02-12 NOTE — Consult Note (Signed)
Referring Provider: ED Primary Care Physician:  Mila Palmer, MD Primary Gastroenterologist:  Dr. Bosie Clos  Reason for Consultation:  GI bleed  HPI: Leroy Cochran is a 52 y.o. male presented to the hospital with rectal bleeding.  He underwent screening colonoscopy on February 07, 2023 with Dr. Bosie Clos.  He was found to have 7 polyps including one large, 35 mm polyp at the sigmoid colon which was removed with a hot snare.  4 other polyps ranging in size from 10 to 16 mm in  ascending colon, transverse colon and descending colon.  Also had diverticulosis and internal hemorrhoids.  He had 1 episode of bright red blood per rectum on the day of procedure but subsequently it stopped.  Again started having bright red blood per rectum last night.  Denies any dizziness.  Having some left lower quadrant abdominal discomfort but denies any abdominal pain.  Denies nausea or vomiting.  Denies NSAID use. Past Medical History:  Diagnosis Date   Essential hypertension    Hyperlipidemia    Insomnia    OSA on CPAP    Dr.Turner;; CPAP -1, (RDI=82.7, Low O2 - 78%)    Past Surgical History:  Procedure Laterality Date   CPX - CARDIOPULMONARY EXERCISE TEST  04/17/2017    Limited due to submaximal exercise (did not reach target heart rate).  Response.  Some shortness of breath related to abnormal relaxation.  No cardiopulmonary limitations.  Limitations more related to deconditioning and body habitus.   WISDOM TOOTH EXTRACTION      Prior to Admission medications   Medication Sig Start Date End Date Taking? Authorizing Provider  Armodafinil 50 MG tablet Take 50 mg by mouth every morning. 02/10/23  Yes [provider]  [employer] GENTLE LAXATIVE 5 MG EC tablet See admin instructions. 01/31/23  Yes [provider]  ondansetron (ZOFRAN-ODT) 4 MG disintegrating tablet Take 4 mg by mouth every 8 (eight) hours as needed. 12/12/22  Yes [provider]  pantoprazole (PROTONIX) 40 MG tablet Take 40  mg by mouth daily. 02/07/23  Yes [provider]  carvedilol (COREG) 6.25 MG tablet Take 6.25 mg by mouth 2 (two) times daily. 12/03/21   [provider]  cetirizine (ZYRTEC) 10 MG tablet Take 10 mg by mouth daily as needed for allergies.    [provider]  fluticasone (FLONASE) 50 MCG/ACT nasal spray Place 1 spray into both nostrils daily as needed for allergies. 10/15/15   [provider]  lidocaine (XYLOCAINE) 2 % solution Use as directed 15 mLs in the mouth or throat as needed for mouth pain. 11/13/22   Rexford Maus, DO  Semaglutide-Weight Management (WEGOVY) 2.4 MG/0.75ML SOAJ Inject 2.4 mg into the skin once a week. 10/12/22     SYMBICORT 80-4.5 MCG/ACT inhaler Inhale 1 puff into the lungs daily. 04/29/20   [provider]  TRIBENZOR 40-5-25 MG TABS Take 1 tablet by mouth daily. 03/30/20   [provider]    Scheduled Meds: Continuous Infusions: PRN Meds:.  Allergies as of 02/12/2023   (No Known Allergies)    Family History  Problem Relation Age of Onset   Healthy Father    Cervical cancer Mother    Healthy Sister    Heart attack Maternal Grandfather        In his 53s   Healthy Daughter     Social History   Socioeconomic History   Marital status: Divorced    Spouse name: Not on file   Number of children:  1   Years of education: Not on file   Highest education level: High school graduate  Occupational History   Not on file  Tobacco Use   Smoking status: Never   Smokeless tobacco: Never  Substance and Sexual Activity   Alcohol use: Yes    Comment: occasional wine   Drug use: No   Sexual activity: Not on file  Other Topics Concern   Not on file  Social History Narrative   He is a divorced father of 1 daughter.  He lives with his daughter.   Social Determinants of Health   Financial Resource Strain: Not on file  Food Insecurity: Not on file  Transportation Needs: Not on file  Physical Activity:  Insufficiently Active (04/02/2017)   Exercise Vital Sign    Days of Exercise per Week: 1 day    Minutes of Exercise per Session: 20 min  Stress: Not on file  Social Connections: Not on file  Intimate Partner Violence: Not on file    Review of Systems: All negative except as stated above in HPI.  Physical Exam: Vital signs: Vitals:   02/12/23 0721  BP: 136/74  Pulse: 81  Resp: 17  Temp: 98 F (36.7 C)  SpO2: 100%   Physical Exam Vitals reviewed.  Constitutional:      General: He is not in acute distress.    Appearance: Normal appearance.  HENT:     Head: Normocephalic and atraumatic.     Nose: Nose normal.  Eyes:     General: No scleral icterus.    Extraocular Movements: Extraocular movements intact.  Cardiovascular:     Rate and Rhythm: Normal rate and regular rhythm.     Heart sounds: Normal heart sounds.  Pulmonary:     Effort: Pulmonary effort is normal. No respiratory distress.     Breath sounds: Normal breath sounds.  Abdominal:     General: Bowel sounds are normal. There is no distension.     Palpations: Abdomen is soft.     Tenderness: There is no abdominal tenderness. There is no guarding.  Musculoskeletal:        General: No swelling.     Cervical back: Normal range of motion.  Skin:    General: Skin is warm.     Coloration: Skin is not jaundiced.  Neurological:     General: No focal deficit present.     Mental Status: He is alert.  Psychiatric:        Mood and Affect: Mood normal.        Behavior: Behavior normal.        Thought Content: Thought content normal.        Judgment: Judgment normal.      GI:  Lab Results: Recent Labs    02/12/23 0723  WBC 12.9*  HGB 11.2*  HCT 36.8*  PLT 265   BMET Recent Labs    02/12/23 0723  NA 138  K 3.5  CL 107  CO2 22  GLUCOSE 161*  BUN 18  CREATININE 1.15  CALCIUM 8.6*   LFT Recent Labs    02/12/23 0723  PROT 6.4*  ALBUMIN 3.3*  AST 16  ALT 19  ALKPHOS 56  BILITOT 0.6    PT/INR No results for input(s): "LABPROT", "INR" in the last 72 hours.   Studies/Results: DG Chest 2 View  Result Date: 02/12/2023 CLINICAL DATA:  Blood in the stool status post colonoscopy. EXAM: CHEST - 2 VIEW COMPARISON:  December 15, 2021 FINDINGS:  The cardiomediastinal silhouette is normal in contour. No pleural effusion. No pneumothorax. No acute pleuroparenchymal abnormality. Visualized abdomen is unremarkable. No acute osseous abnormality noted. IMPRESSION: No acute cardiopulmonary abnormality. No radiographically evident free air. Electronically Signed   By: Meda Klinefelter M.D.   On: 02/12/2023 09:52    Impression/Plan: -Rectal bleeding in a patient who had a colonoscopy few days ago.  Most likely post polypectomy bleeding. -Mild blood loss anemia  Recommendations -------------------------- -Colonoscopy report from our electronic health records reviewed.   -Recommend observation overnight. -Okay to have full liquid diet.  If ongoing bleeding, he may need a repeat colonoscopy for clips placement at post polypectomy sites. -Repeat labs in the morning.  GI will follow.    LOS: 0 days   Kathi Der  MD, FACP 02/12/2023, 10:11 AM  Contact #  [phone]

## 2023-02-12 NOTE — ED Provider Notes (Signed)
[place] EMERGENCY DEPARTMENT AT [facility] Provider Note   CSN: [id number] Arrival date & time: 02/12/23  6578     History  Chief Complaint  Patient presents with   Rectal Bleeding    Leroy Cochran is a 52 y.o. male with PMH as listed below who presents with rectal bleeding. Patient c/o bright red blood in toilet four times since last night. t states had a colonoscopy on Tues w/ [facility] and they removed 7 polyps and one was large. Pt states had only one bloody stool after the colonoscopy, but got worse and increased yesterday and today. Endorses feeling generally weak and fatigued but denies dyspnea, CP, or lightheadedness. Does not take blood thinner. Endorses very mild lower abdominal discomfort and nausea but no vomiting or abdominal pain.    Past Medical History:  Diagnosis Date   Essential hypertension    Hyperlipidemia    Insomnia    OSA on CPAP    Dr.Turner;; CPAP -1, (RDI=82.7, Low O2 - 78%)       Home Medications Prior to Admission medications   Medication Sig Start Date End Date Taking? Authorizing Provider  carvedilol (COREG) 6.25 MG tablet Take 6.25 mg by mouth 2 (two) times daily. 12/03/21  Yes [provider]  ondansetron (ZOFRAN-ODT) 4 MG disintegrating tablet Take 4 mg by mouth every 8 (eight) hours as needed. 12/12/22  Yes [provider]  SYMBICORT 80-4.5 MCG/ACT inhaler Inhale 2-3 puffs into the lungs as needed (wheezing/SOB). 04/29/20  Yes [provider]  TRIBENZOR 40-5-25 MG TABS Take 1 tablet by mouth daily. 03/30/20  Yes [provider]  Armodafinil 50 MG tablet Take 50 mg by mouth every morning. 02/10/23   [provider]  cetirizine (ZYRTEC) 10 MG tablet Take 10 mg by mouth daily as needed for allergies.    [provider]  [employer] GENTLE LAXATIVE 5 MG EC tablet See admin instructions. 01/31/23   [provider]  fluticasone (FLONASE) 50 MCG/ACT nasal spray Place 1 spray into both  nostrils daily as needed for allergies. 10/15/15   [provider]  lidocaine (XYLOCAINE) 2 % solution Use as directed 15 mLs in the mouth or throat as needed for mouth pain. 11/13/22   Elayne Snare K, DO  pantoprazole (PROTONIX) 40 MG tablet Take 40 mg by mouth daily. Patient not taking: Reported on 02/12/2023 02/07/23   [provider]  Semaglutide-Weight Management (WEGOVY) 2.4 MG/0.75ML SOAJ Inject 2.4 mg into the skin once a week. Patient not taking: Reported on 02/12/2023 10/12/22         Allergies    Patient has no known allergies.    Review of Systems   Review of Systems A 10 point review of systems was performed and is negative unless otherwise reported in HPI.  Physical Exam Updated Vital Signs BP 136/74   Pulse 81   Temp 98 F (36.7 C) (Oral)   Resp 17   Ht 5\' 11"  (1.803 m)   Wt 114.8 kg   SpO2 100%   BMI 35.30 kg/m  Physical Exam General: Normal appearing male, lying in bed.  HEENT: Sclera anicteric, MMM, trachea midline.  Cardiology: RRR, no murmurs/rubs/gallops.  Resp: Normal respiratory rate and effort. CTAB, no wheezes, rhonchi, crackles.  Abd: Very mild LLQ TTP. Soft, non-distended. No rebound tenderness or guarding.  GU: Deferred. MSK: No peripheral edema or signs of trauma.  Skin: warm, dry. Back: No CVA tenderness Neuro: A&Ox4, CNs II-XII grossly intact. MAEs.  Sensation grossly intact.  Psych: Normal mood and affect.   ED Results / Procedures / Treatments   Labs (all labs ordered are listed, but only abnormal results are displayed) Labs Reviewed  COMPREHENSIVE METABOLIC PANEL - Abnormal; Notable for the following components:      Result Value   Glucose, Bld 161 (*)    Calcium 8.6 (*)    Total Protein 6.4 (*)    Albumin 3.3 (*)    All other components within normal limits  CBC - Abnormal; Notable for the following components:   WBC 12.9 (*)    Hemoglobin 11.2 (*)    HCT 36.8 (*)    MCH 25.2 (*)    All other components within  normal limits  LACTIC ACID, PLASMA  LACTIC ACID, PLASMA  POC OCCULT BLOOD, ED  TYPE AND SCREEN    EKG None  Radiology DG Chest 2 View  Result Date: 02/12/2023 CLINICAL DATA:  Blood in the stool status post colonoscopy. EXAM: CHEST - 2 VIEW COMPARISON:  December 15, 2021 FINDINGS: The cardiomediastinal silhouette is normal in contour. No pleural effusion. No pneumothorax. No acute pleuroparenchymal abnormality. Visualized abdomen is unremarkable. No acute osseous abnormality noted. IMPRESSION: No acute cardiopulmonary abnormality. No radiographically evident free air. Electronically Signed   By: Meda Klinefelter M.D.   On: 02/12/2023 09:52    Procedures Procedures    Medications Ordered in ED Medications  ondansetron (ZOFRAN) injection 4 mg (4 mg Intravenous Given 02/12/23 1610)    ED Course/ Medical Decision Making/ A&P                          Medical Decision Making Amount and/or Complexity of Data Reviewed Labs: ordered. Decision-making details documented in ED Course. Radiology: ordered.  Risk Prescription drug management. Decision regarding hospitalization.    This patient presents to the ED for concern of rectal bleeding, this involves an extensive number of treatment options, and is a complaint that carries with it a high risk of complications and morbidity.  I considered the following differential and admission for this acute, potentially life threatening condition.   MDM:    Consider a bleeding complication post colonoscopy especially with removal of large polyps.  Patient has mild left lower quadrant tenderness palpation however no evidence of peritonitis on exam.  Will get x-ray to rule out free air under the diaphragm.  Patient's hemoglobin has dropped from 14 3 months ago to 11.2 now.  Cannot exactly specify on the acuity of this drop however must assume that it is from this bleeding.  He is overall hemodynamically stable and normotensive, no concern for  hemorrhagic shock.  He does not yet need a blood transfusion but has had an 3 point drop in his hemoglobin.  Will consult with GI.  Will potentially need admission or another colonoscopy.  Clinical Course as of 02/12/23 1021  Sun Feb 12, 2023  0820 WBC(!): 12.9 +leukocytosis [HN]  0820 Hemoglobin(!): 11.2 Hgb 3 months ago was 14 [HN]  0856 Lactic Acid, Venous: 1.1 wnl [HN]  0856 Comprehensive metabolic panel(!) Unremarkable in the context of this patient's presentation  [HN]  1020 CXR with no free air under diaphragm. D/w Dr. Levora Angel with GI who recommends admission for observation, Hgb recheck. Patient is admitted to hospitalist.  [HN]    Clinical Course User Index [HN] Loetta Rough, MD    Labs: I Ordered, and personally interpreted labs.  The pertinent results include: Those listed  above  Imaging Studies ordered: I ordered imaging studies including chest x-ray I independently visualized and interpreted imaging. I agree with the radiologist interpretation  Additional history obtained from chart review.  External records from outside source obtained and reviewed including [facility]  Cardiac Monitoring: The patient was maintained on a cardiac monitor.  I personally viewed and interpreted the cardiac monitored which showed an underlying rhythm of: Normal sinus rhythm  Reevaluation: After the interventions noted above, I reevaluated the patient and found that they have :improved  Social Determinants of Health:  lives independently  Disposition:  Admitted to medicine w/ GI following  Co morbidities that complicate the patient evaluation  Past Medical History:  Diagnosis Date   Essential hypertension    Hyperlipidemia    Insomnia    OSA on CPAP    Dr.Turner;; CPAP -1, (RDI=82.7, Low O2 - 78%)     Medicines Meds ordered this encounter  Medications   ondansetron (ZOFRAN) injection 4 mg    I have reviewed the patients home medicines and have made adjustments as  needed  Problem List / ED Course: Problem List Items Addressed This Visit       Digestive   * (Principal) Rectal bleeding - Primary   Other Visit Diagnoses     Status post colonoscopy with polypectomy                       This note was created using dictation software, which may contain spelling or grammatical errors.    Loetta Rough, MD 02/12/23 1021

## 2023-02-12 NOTE — Plan of Care (Signed)
Problem: Education: Goal: Knowledge of General Education information will improve Description: Including pain rating scale, medication(s)/side effects and non-pharmacologic comfort measures Outcome: Progressing   Problem: Health Behavior/Discharge Planning: Goal: Ability to manage health-related needs will improve Outcome: Progressing   Problem: Clinical Measurements: Goal: Ability to maintain clinical measurements within normal limits will improve Outcome: Progressing   Problem: Clinical Measurements: Goal: Will remain free from infection Outcome: Progressing   Problem: Clinical Measurements: Goal: Diagnostic test results will improve Outcome: Progressing   Problem: Activity: Goal: Risk for activity intolerance will decrease Outcome: Progressing   Problem: Nutrition: Goal: Adequate nutrition will be maintained Outcome: Progressing   Problem: Pain Managment: Goal: General experience of comfort will improve Outcome: Progressing

## 2023-02-13 DIAGNOSIS — K625 Hemorrhage of anus and rectum: Secondary | ICD-10-CM | POA: Diagnosis not present

## 2023-02-13 DIAGNOSIS — D509 Iron deficiency anemia, unspecified: Secondary | ICD-10-CM

## 2023-02-13 LAB — CBC
HCT: 34.4 % — ABNORMAL LOW (ref 39.0–52.0)
Hemoglobin: 10.6 g/dL — ABNORMAL LOW (ref 13.0–17.0)
MCH: 25.4 pg — ABNORMAL LOW (ref 26.0–34.0)
MCHC: 30.8 g/dL (ref 30.0–36.0)
MCV: 82.5 fL (ref 80.0–100.0)
Platelets: 230 10*3/uL (ref 150–400)
RBC: 4.17 MIL/uL — ABNORMAL LOW (ref 4.22–5.81)
RDW: 14.4 % (ref 11.5–15.5)
WBC: 7.6 10*3/uL (ref 4.0–10.5)
nRBC: 0 % (ref 0.0–0.2)

## 2023-02-13 LAB — ABO/RH: ABO/RH(D): O POS

## 2023-02-13 MED ORDER — FERROUS SULFATE 325 (65 FE) MG PO TBEC
325.0000 mg | DELAYED_RELEASE_TABLET | Freq: Two times a day (BID) | ORAL | 0 refills | Status: AC
Start: 2023-02-13 — End: 2023-03-15

## 2023-02-13 NOTE — Plan of Care (Signed)

## 2023-02-13 NOTE — Discharge Summary (Addendum)
Name: Leroy Cochran MRN: [id number] DOB: 10-05-1970 52 y.o. PCP: Mila Palmer, MD  Date of Admission: 02/12/2023  7:15 AM Date of Discharge:  02/13/2023 Attending Physician: Dr. Criselda Peaches  DISCHARGE DIAGNOSIS:  Primary Problem: Rectal bleeding   Hospital Problems: Principal Problem:   Rectal bleeding    DISCHARGE MEDICATIONS:   Allergies as of 02/13/2023   No Known Allergies      Medication List     STOP taking these medications    lidocaine 2 % solution Commonly known as: XYLOCAINE   ondansetron 4 MG disintegrating tablet Commonly known as: ZOFRAN-ODT       TAKE these medications    Armodafinil 50 MG tablet Take 50 mg by mouth every morning.   carvedilol 6.25 MG tablet Commonly known as: COREG Take 6.25 mg by mouth 2 (two) times daily.   cetirizine 10 MG tablet Commonly known as: ZYRTEC Take 10 mg by mouth daily as needed for allergies.   [employer] Gentle Laxative 5 MG EC tablet Generic drug: bisacodyl See admin instructions.   ferrous sulfate 325 (65 FE) MG EC tablet Take 1 tablet (325 mg total) by mouth 2 (two) times daily.   fluticasone 50 MCG/ACT nasal spray Commonly known as: FLONASE Place 2 sprays into both nostrils as needed for allergies.   pantoprazole 40 MG tablet Commonly known as: PROTONIX Take 40 mg by mouth daily.   Symbicort 80-4.5 MCG/ACT inhaler Generic drug: budesonide-formoterol Inhale 2-3 puffs into the lungs as needed (wheezing/SOB).   Tribenzor 40-5-25 MG Tabs Generic drug: Olmesartan-amLODIPine-HCTZ Take 1 tablet by mouth daily.   Wegovy 2.4 MG/0.75ML Soaj Generic drug: Semaglutide-Weight Management Inject 2.4 mg into the skin once a week.       DISPOSITION AND FOLLOW-UP:  Leroy Cochran was discharged from [facility] in Stable condition. At the hospital follow up visit please address:  Rectal bleeding s/p polypectomy: Follow up on if patient is having any continued bleeding with bowel  movements. Recheck H/H. If he has continued bleeding, may need repeat colonoscopy with clip placement per GI.   Iron deficiency anemia: Started on oral iron at discharge. Patient says he is unable to tolerate oral iron well, but is agreeable to try it until follow up appointment. He would like to schedule IV iron infusions instead as a long term solution.   HTN: Continued Coreg 6.25 mg BID, tribenzor 40-5-25 mg daily. Recheck BP and screen for hypotension symptoms at next visit, as BP was low-normal during admission while off medications.   Obstructive sleep apnea: ensure he is continuing to use CPAP.  Follow-up Recommendations: Consults: Gastroenterology, if he has continued bleeding Labs: H/H Medications: Started twice daily oral iron; discuss with patient switching to outpatient IV iron infusions if unable to tolerate oral.   Follow-up Appointments:  Follow-up Information     Mila Palmer, MD. Call in 1 week(s).   Specialty: Family Medicine Why: Call and make an appointment for hospital follow up in 1-2 weeks Contact information: [address] [place] [address] [phone]                HOSPITAL COURSE:  Patient Summary: Leroy Cochran is a 52 y.o. who presented with recurrent hematochezia after a colonoscopy on Tuesday 9/17.    Rectal Bleeding  Had a colonoscopy on 9/17 with polypectomy of several large polyps. Since then, he endorsed several episodes of bright red blood with bowel movements. He started to feel weak, so he  came to the hospital. He was hemodynamically stable throughout the hospital course. Hgb was 11.2, down from baseline around 14; it continued to drop until 10.6, then stabilized. GI was consulted and recommended monitoring symptoms and hgb. His hemoglobin has remained stable and he is asymptomatic.   Iron deficiency anemia Iron labs were consistent with iron deficiency at 22 with a Ferritin of 51 and saturation at 7%. Will  need 719 mg based on Ganzoni equation. We will discharge him with oral iron repletion, but he indicated he does not tolerate it well, so recommend he follow up with PCP about outpatient IV iron replacement.    Obstructive sleep apnea Continue CPAP.    Hypertension  Well controlled at home with coreg 6.25mg  BID and tibenzor 40-5-25mg  daily. BP stable throughout hospitalization. Will restart BP meds on discharge.    DISCHARGE INSTRUCTIONS:   Discharge Instructions     Call MD for:  difficulty breathing, headache or visual disturbances   Complete by: As directed    Call MD for:  extreme fatigue   Complete by: As directed    Call MD for:  persistant dizziness or light-headedness   Complete by: As directed    Call MD for:  severe uncontrolled pain   Complete by: As directed    Diet - low sodium heart healthy   Complete by: As directed    Discharge instructions   Complete by: As directed    It was a pleasure taking care of you while you were in the hospital.  You were admitted for rectal bleeding following your recent colonoscopy with polyp removal.  During your hospital stay, we monitored your blood levels and symptoms.  Your bleeding has decreased and relevant laboratory values have remained stable, so we feel you are medically stable for discharge and outpatient follow up.  Please call and make an appointment to follow-up outpatient with your primary care provider in the next 1-2 weeks.   For your bleeding, we expect it to continue to decrease over the next week. If you have increased amounts of bleeding and/or have weakness, dizziness please return to the emergency department.   For your iron deficiency anemia, we are sending you home with oral iron.  Please take 1 oral iron tablet twice daily until you follow-up with your primary care provider.  I will send a message over to them about your preference for IV iron infusions.  Continue taking the following medications as prescribed:  Protonix 40 mg daily, Carvedilol 6.25 mg twice daily, Tribenzor 40-5-25 mg once daily, Symbicort as needed, flonase as needed, bisacodyl as needed, and zyrtec as needed for allergies.   Increase activity slowly   Complete by: As directed        SUBJECTIVE:   Patient evaluated at bedside this morning. He says he is doing well.  He reports that he had a little blood in his last bowel movement, but it is significantly decreased from yesterday.  Denies any lightheadedness, dizziness, shortness of breath.  Discharge Vitals:   BP 126/86 (BP Location: Left Arm)   Pulse (!) 56   Temp 98.1 F (36.7 C) (Oral)   Resp 12   Ht 5\' 11"  (1.803 m)   Wt 114.8 kg   SpO2 100%   BMI 35.30 kg/m   OBJECTIVE:  Physical Exam Constitutional:      Appearance: Normal appearance.  HENT:     Mouth/Throat:     Mouth: Mucous membranes are moist.  Cardiovascular:  Rate and Rhythm: Normal rate and regular rhythm.     Pulses: Normal pulses.     Heart sounds: Normal heart sounds.  Pulmonary:     Effort: Pulmonary effort is normal.     Breath sounds: Normal breath sounds.  Abdominal:     General: Abdomen is flat. Bowel sounds are normal.     Palpations: Abdomen is soft.  Neurological:     General: No focal deficit present.     Mental Status: He is alert and oriented to person, place, and time.  Psychiatric:        Mood and Affect: Mood normal.        Behavior: Behavior normal.     Pertinent Labs, Studies, and Procedures:     Latest Ref Rng & Units 02/13/2023    8:05 AM 02/12/2023    3:00 PM 02/12/2023    7:23 AM  CBC  WBC 4.0 - 10.5 K/uL 7.6  11.2  12.9   Hemoglobin 13.0 - 17.0 g/dL 25.3  66.4  40.3   Hematocrit 39.0 - 52.0 % 34.4  33.9  36.8   Platelets 150 - 400 K/uL 230  210  265        Latest Ref Rng & Units 02/12/2023    7:23 AM 11/13/2022    3:40 PM 12/15/2021   10:26 AM  CMP  Glucose 70 - 99 mg/dL 474  96  259   BUN 6 - 20 mg/dL 18  13  17    Creatinine 0.61 - 1.24 mg/dL 5.63  8.75   6.43   Sodium 135 - 145 mmol/L 138  140  142   Potassium 3.5 - 5.1 mmol/L 3.5  3.3  3.3   Chloride 98 - 111 mmol/L 107  106  104   CO2 22 - 32 mmol/L 22  26  27    Calcium 8.9 - 10.3 mg/dL 8.6  9.4  9.7   Total Protein 6.5 - 8.1 g/dL 6.4     Total Bilirubin 0.3 - 1.2 mg/dL 0.6     Alkaline Phos 38 - 126 U/L 56     AST 15 - 41 U/L 16     ALT 0 - 44 U/L 19       DG Chest 2 View  Result Date: 02/12/2023 CLINICAL DATA:  Blood in the stool status post colonoscopy. EXAM: CHEST - 2 VIEW COMPARISON:  December 15, 2021 FINDINGS: The cardiomediastinal silhouette is normal in contour. No pleural effusion. No pneumothorax. No acute pleuroparenchymal abnormality. Visualized abdomen is unremarkable. No acute osseous abnormality noted. IMPRESSION: No acute cardiopulmonary abnormality. No radiographically evident free air. Electronically Signed   By: Meda Klinefelter M.D.   On: 02/12/2023 09:52     Signed: Annett Fabian, MD Internal Medicine Resident, PGY-1 [facility]  Pager: [phone] 11:35 AM, 02/13/2023

## 2023-02-13 NOTE — Hospital Course (Addendum)
Leroy Cochran is a 52 y.o. who presented with recurrent hematochezia after a colonoscopy on Tuesday 9/17.    Rectal Bleeding  Had a colonoscopy on 9/17 with polypectomy of several large polyps. Since then, he endorsed several episodes of bright red blood with bowel movements. He started to feel weak, so he came to the hospital. He was hemodynamically stable throughout the hospital course. Hgb was 11.2, down from baseline around 14; it continued to drop until 10.6, then stabilized. GI was consulted and recommended monitoring symptoms and hgb. His hemoglobin has remained stable and he is asymptomatic.   Iron deficiency anemia Iron labs were consistent with iron deficiency at 22 with a Ferritin of 51 and saturation at 7%. Will need 719 mg based on Ganzoni equation. We will discharge him with oral iron repletion, but he indicated he does not tolerate it well, so recommend he follow up with PCP about outpatient IV iron replacement.    Obstructive sleep apnea Continue CPAP.    Hypertension  Well controlled at home with coreg 6.25mg  BID and tibenzor 40-5-25mg  daily. BP stable throughout hospitalization. Will restart BP meds on discharge.

## 2023-02-13 NOTE — Progress Notes (Signed)
Transition of Care [facility]) - Inpatient Brief Assessment   Patient Details  Name: Leroy Cochran MRN: [id number] Date of Birth: 28-Nov-1970  Transition of Care [facility]) CM/SW Contact:    Janae Bridgeman, RN Phone Number: 02/13/2023, 12:06 PM   Clinical Narrative: Transition of Care [facility]) - Inpatient Brief Assessment   Patient Details  Name: Leroy Cochran MRN: [id number] Date of Birth: Mar 29, 1971  Transition of Care [facility]) CM/SW Contact:    Janae Bridgeman, RN Phone Number: 02/13/2023, 12:06 PM   Clinical Narrative: Patient admitted for rectal bleeding.  No TOC needs at this time.   Transition of Care Asessment: Insurance and Status: (P) Insurance coverage has been reviewed Patient has primary care physician: (P) Yes Home environment has been reviewed: (P) From home Prior level of function:: (P) Independent Prior/Current Home Services: (P) No current home services Social Determinants of Health Reivew: (P) SDOH reviewed no interventions necessary Readmission risk has been reviewed: (P) Yes Transition of care needs: (P) no transition of care needs at this time    Transition of Care Asessment: Insurance and Status: (P) Insurance coverage has been reviewed Patient has primary care physician: (P) Yes Home environment has been reviewed: (P) From home Prior level of function:: (P) Independent Prior/Current Home Services: (P) No current home services Social Determinants of Health Reivew: (P) SDOH reviewed no interventions necessary Readmission risk has been reviewed: (P) Yes Transition of care needs: (P) no transition of care needs at this time

## 2023-02-13 NOTE — Progress Notes (Addendum)
[facility] Progress Note  Leroy Cochran 52 y.o. Feb 12, 1971   Subjective: Reports rectal bleeding (minimal stool) last night that was less in volume than Saturday. Denies abdominal pain. Wife at bedside.  Objective: Vital signs: Vitals:   02/13/23 0416 02/13/23 0734  BP: 105/72 126/86  Pulse: (!) 56 (!) 56  Resp: 18 12  Temp:  98.1 F (36.7 C)  SpO2: 100% 100%    Physical Exam: Gen: alert, no acute distress, well-nourished  HEENT: anicteric sclera CV: RRR Chest: CTA B Abd: soft, nontender, nondistended, +BS Ext: no edema  Lab Results: Recent Labs    02/12/23 0723  NA 138  K 3.5  CL 107  CO2 22  GLUCOSE 161*  BUN 18  CREATININE 1.15  CALCIUM 8.6*   Recent Labs    02/12/23 0723  AST 16  ALT 19  ALKPHOS 56  BILITOT 0.6  PROT 6.4*  ALBUMIN 3.3*   Recent Labs    02/12/23 1500 02/13/23 0805  WBC 11.2* 7.6  HGB 10.6* 10.6*  HCT 33.9* 34.4*  MCV 84.1 82.5  PLT 210 230      Assessment/Plan: Rectal bleeding likely due to post-polypectomy bleed that I think has resolved and he is having residual bleeding. Hgb stable. Suspect he will have small amounts of bleeding for the next 1-2 days. Changed diet to soft. Ok to go home today from GI standpoint and advised to stay home from work until mid-week. If bleeding increases to level of admit then will need a repeat colonoscopy or sigmoidoscopy to evaluate. Will f/u on path from outpt colonoscopy when available.   Shirley Friar 02/13/2023, 9:28 AM  Questions please call [phone] ID: Leroy Cochran, male   DOB: 1970-08-25, 52 y.o.   MRN: [id number]

## 2023-03-07 ENCOUNTER — Other Ambulatory Visit: Payer: Self-pay | Admitting: Student

## 2023-10-18 ENCOUNTER — Emergency Department (HOSPITAL_BASED_OUTPATIENT_CLINIC_OR_DEPARTMENT_OTHER)
Admission: EM | Admit: 2023-10-18 | Discharge: 2023-10-18 | Disposition: A | Discharge status: Home / Self Care | Attending: Emergency Medicine | Admitting: Emergency Medicine

## 2023-10-18 ENCOUNTER — Other Ambulatory Visit: Payer: Self-pay

## 2023-10-18 ENCOUNTER — Encounter (HOSPITAL_BASED_OUTPATIENT_CLINIC_OR_DEPARTMENT_OTHER): Payer: Self-pay | Admitting: *Deleted

## 2023-10-18 ENCOUNTER — Emergency Department (HOSPITAL_BASED_OUTPATIENT_CLINIC_OR_DEPARTMENT_OTHER)

## 2023-10-18 DIAGNOSIS — R519 Headache, unspecified: Secondary | ICD-10-CM | POA: Diagnosis present

## 2023-10-18 DIAGNOSIS — M62838 Other muscle spasm: Secondary | ICD-10-CM | POA: Diagnosis not present

## 2023-10-18 DIAGNOSIS — Y92410 Unspecified street and highway as the place of occurrence of the external cause: Secondary | ICD-10-CM | POA: Insufficient documentation

## 2023-10-18 MED ORDER — CYCLOBENZAPRINE HCL 10 MG PO TABS: 10.0000 mg | ORAL_TABLET | Freq: Two times a day (BID) | ORAL | 0 refills | Status: AC | PRN

## 2023-10-18 MED ORDER — ACETAMINOPHEN 500 MG PO TABS
1000.0000 mg | ORAL_TABLET | Freq: Once | ORAL | Status: AC
  Administered 2023-10-18: 1000 mg via ORAL
  Filled 2023-10-18: qty 2

## 2023-10-18 NOTE — ED Notes (Signed)
 Patient transported to CT

## 2023-10-18 NOTE — ED Notes (Signed)
 RN reviewed discharge instructions with pt. Pt verbalized understanding and had no further questions. VSS upon discharge.

## 2023-10-18 NOTE — Discharge Instructions (Signed)
 Follow-up with primary care doctor as needed.  Recommend Tylenol  and ibuprofen  for pain.  1000 mg of Tylenol  every 6 hours as needed for pain.  400 mg ibuprofen  every 8 hours as needed for pain.  I prescribed you a muscle relaxant called Flexeril to use as needed.  This medication is sedating so please be careful with its use.  Do not mix with alcohol drugs or dangerous activities including driving.
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
## 2024-03-06 ENCOUNTER — Other Ambulatory Visit (HOSPITAL_BASED_OUTPATIENT_CLINIC_OR_DEPARTMENT_OTHER): Payer: Self-pay

## 2024-06-07 NOTE — Progress Notes (Signed)
 " Cardiology Office Note:  .   Date:  06/10/2024  ID:  Leroy Cochran, DOB 04-Jun-1970, MRN [id number] PCP: Stamey, Chiquita POUR, FNP  [facility] Providers Cardiologist:  None   History of Present Illness: .    Chief Complaint  Patient presents with   Follow-up    Leroy Cochran is a 54 y.o. male with below history who presents for follow-up.   History of Present Illness   Leroy Cochran is a 54 year old male with myocarditis and hypertension who presents for follow-up. He is accompanied by his daughter.  He experiences occasional chest pain and shortness of breath, though these symptoms have become less frequent. 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  He has a history of hypertension and is currently taking Tribenzor and Carvedilol. He did not take his medication on the morning of the visit and has been taking his medication in the evenings. He does not monitor his blood pressure at home but acknowledges that it has been somewhat elevated during recent doctor visits.  He underwent a colonoscopy where cancerous polyps were found and removed. A follow-up colonoscopy this year resulted in the removal of additional polyps. The frequency of colonoscopies has now been extended to every three years.  He has sleep apnea and uses a CPAP machine regularly. No recent leg swelling is noted. He is not a smoker and only drinks alcohol occasionally.           Problem List 1. Myocarditis  -05/08/2020 -CCTA with normal coronaries, calcium score 0 2. HTN 3. LVH, moderate -moderate on CMR 4. OSA -wears machine  5. Obesity    ROS: All other ROS reviewed and negative. Pertinent positives noted in the HPI.     Studies Reviewed: SABRA   EKG Interpretation Date/Time:  Monday June 10 2024 11:10:49 EST Ventricular Rate:  67 PR Interval:  142 QRS  Duration:  104 QT Interval:  400 QTC Calculation: 422 R Axis:   32  Text Interpretation: Normal sinus rhythm T wave abnormality, consider inferolateral ischemia Confirmed by Barbaraann Kotyk [phone]) on 06/10/2024 11:18:19 AM   CMR 05/08/2020 IMPRESSION: 1. Findings consistent with acute myocarditis, including elevated native T1/T2/ECV and midwall late gadolinium enhancement in the mid anterior wall   2. Normal LV size, moderate concentric hypertrophy, and normal systolic function (EF 61%)   3.  Normal RV size and systolic function (EF 57%)   Physical Exam:   VS:  BP (!) 136/92   Pulse 74   Ht 5' 11 (1.803 m)   Wt 255 lb 9.6 oz (115.9 kg)   SpO2 97%   BMI 35.65 kg/m    Wt Readings from Last 3 Encounters:  06/10/24 255 lb 9.6 oz (115.9 kg)  10/18/23 255 lb (115.7 kg)  02/12/23 253 lb 1.4 oz (114.8 kg)    GEN: Well nourished, well developed in no acute distress NECK: No JVD; No carotid bruits CARDIAC: RRR, no murmurs, rubs, gallops RESPIRATORY:  Clear to auscultation without rales, wheezing or rhonchi  ABDOMEN: Soft, non-tender, non-distended EXTREMITIES:  No edema; No deformity  ASSESSMENT AND PLAN: .   Assessment and Plan    Idiopathic myocarditis Diagnosed in 2021. Occasional chest pain and shortness of breath. EKG abnormal but stable. No heart-related shortness of breath. - Ordered repeat heart ultrasound to assess cardiac function stability.  Primary hypertension Blood pressure  elevated at 136/92 mmHg. Missed morning dose of medication. No home monitoring. Target BP <130/80 mmHg. - Instructed to check blood pressure daily for 2-3 weeks and record readings. - Advised to send blood pressure log via MyChart. - Continue current antihypertensive medications.                Follow-up: Return in about 1 year (around 06/10/2025).  Signed, Darryle DASEN. Barbaraann, MD, [facility]  [facility]  [address] [place], [place] [address] [phone]  11:38  AM   "

## 2024-06-10 ENCOUNTER — Ambulatory Visit (INDEPENDENT_AMBULATORY_CARE_PROVIDER_SITE_OTHER): Admitting: Cardiovascular Disease

## 2024-06-10 ENCOUNTER — Encounter (HOSPITAL_BASED_OUTPATIENT_CLINIC_OR_DEPARTMENT_OTHER): Payer: Self-pay | Admitting: Cardiovascular Disease

## 2024-06-10 VITALS — BP 136/92 | HR 74 | Ht 71.0 in | Wt 255.6 lb

## 2024-06-10 DIAGNOSIS — I401 Isolated myocarditis: Secondary | ICD-10-CM | POA: Diagnosis not present

## 2024-06-10 DIAGNOSIS — I10 Essential (primary) hypertension: Secondary | ICD-10-CM | POA: Diagnosis not present

## 2024-06-10 DIAGNOSIS — I517 Cardiomegaly: Secondary | ICD-10-CM | POA: Diagnosis not present

## 2024-06-10 NOTE — Patient Instructions (Addendum)
 Medication Instructions:  No changes today *If you need a refill on your cardiac medications before your next appointment, please call your pharmacy*  Lab Work: None today  Testing/Procedures: Your physician has requested that you have an echocardiogram. Echocardiography is a painless test that uses sound waves to create images of your heart. It provides your doctor with information about the size and shape of your heart and how well your hearts chambers and valves are working. This procedure takes approximately one hour. There are no restrictions for this procedure. Please do NOT wear cologne, perfume, aftershave, or lotions (deodorant is allowed). Please arrive 15 minutes prior to your appointment time.  Please note: We ask at that you not bring children with you during ultrasound (echo/ vascular) testing. Due to room size and safety concerns, children are not allowed in the ultrasound rooms during exams. Our front office staff cannot provide observation of children in our lobby area while testing is being conducted. An adult accompanying a patient to their appointment will only be allowed in the ultrasound room at the discretion of the ultrasound technician under special circumstances. We apologize for any inconvenience.   Follow-Up: At [facility], you and your health needs are our priority.  As part of our continuing mission to provide you with exceptional heart care, our providers are all part of one team.  This team includes your primary Cardiologist (physician) and Advanced Practice Providers or APPs (Physician Assistants and Nurse Practitioners) who all work together to provide you with the care you need, when you need it.  Your next appointment:   12 month(s)  Provider:   Darryle Decent, MD    Other Instructions Check blood pressure daily, keep a list and send via MyChart to Dr. Decent for review in about 2-3 weeks.

## 2024-06-14 ENCOUNTER — Emergency Department (HOSPITAL_BASED_OUTPATIENT_CLINIC_OR_DEPARTMENT_OTHER): Admission: EM | Admit: 2024-06-14 | Discharge: 2024-06-14 | Disposition: A | Discharge status: Home / Self Care

## 2024-06-14 ENCOUNTER — Encounter (HOSPITAL_BASED_OUTPATIENT_CLINIC_OR_DEPARTMENT_OTHER): Payer: Self-pay | Admitting: Emergency Medicine

## 2024-06-14 ENCOUNTER — Emergency Department (HOSPITAL_BASED_OUTPATIENT_CLINIC_OR_DEPARTMENT_OTHER): Admitting: Radiology

## 2024-06-14 DIAGNOSIS — Z79899 Other long term (current) drug therapy: Secondary | ICD-10-CM | POA: Insufficient documentation

## 2024-06-14 DIAGNOSIS — M25532 Pain in left wrist: Secondary | ICD-10-CM | POA: Insufficient documentation

## 2024-06-14 DIAGNOSIS — I10 Essential (primary) hypertension: Secondary | ICD-10-CM | POA: Insufficient documentation

## 2024-06-14 MED ORDER — PREDNISONE 10 MG PO TABS: ORAL_TABLET | ORAL | 0 refills | Status: AC

## 2024-06-14 MED ORDER — NAPROXEN 250 MG PO TABS
500.0000 mg | ORAL_TABLET | Freq: Once | ORAL | Status: AC
  Administered 2024-06-14: 500 mg via ORAL
  Filled 2024-06-14: qty 2

## 2024-06-14 NOTE — Discharge Instructions (Signed)
 The pain in your left wrist may be due to a tendinitis from moving the boxes.  You have been placed into a wrist brace to use as needed for comfort to help with your wrist pain.  You may wean out of the brace as pain improves. We did not see any fracture or dislocation on your x-ray.  Your symptoms also can be consistent with gout.   You have been started on Naproxen here in the ER for your gout. Please  continue to take 500mg  naproxen (Aleve) every 12 hours as needed for pain. Typically, pain should start to resolve within about 7-10 days. Discontinue taking this medication when your pain has resolved.  You have been prescribed prednisone which is a steroid anti-inflammatory. Please take this medication as prescribed for the next 7 days (40mg  on days 1 and 2, 30mg  on days 3 and 4, 20mg  on days 5 and 6, 10mg  on day 7). Take this medication in the morning, as taking it at night may make it hard to sleep. If you are a diabetic, please monitor your blood sugars closely on this medication, as it can cause your blood sugar to rise.   Maintaining a healthy weight and healthy blood pressure can help reduce the risk of flares. Limit consumption of alcohol, sweetened beverages (like sodas and sweet tea), red meats, organ meats (liver), and shellfish. Consumption of these foods can trigger gout flares.   If you have frequent flares, please schedule an appointment to talk to your PCP. They will be able to recommend further strategies for prevention of gout flare ups, which may include preventative medications.  Return to the ER if your symptoms do not start to improve within the next 48 hours, you develop fever or chills, any other new or concerning symptoms

## 2024-06-14 NOTE — ED Triage Notes (Signed)
 Left wrist pain Started yesterday after moving Some swelling Denies known injury to it Tender to touch

## 2024-06-14 NOTE — ED Notes (Signed)
 Reviewed AVS/discharge instructions with patient. Time allotted for and all questions answered. Patient is agreeable for d/c and escorted to ED exit by staff.

## 2024-06-14 NOTE — ED Provider Notes (Signed)
 " [place] EM[facility]GENCY DEPARTMENT AT [facility] Provider Note   CSN: [id number] Arrival date & time: 06/14/24  1839     Patient presents with: Wrist Pain   Leroy Cochran is a 54 y.o. male with history of hypertension, presents with concern for left wrist pain that started yesterday at work.  Reports that he was moving some boxes and the pain seemed to start on the pinky side of his left wrist shortly after moving the boxes.  He denies any true injuries to the wrist.  Reports he has noticed some swelling about the wrist.  Reports pain with any movement of the wrist.  Denies any numbness or paresthesias in his left hand.    Wrist Pain       Prior to Admission medications  Medication Sig Start Date End Date Taking? Authorizing Provider  predniSONE (DELTASONE) 10 MG tablet Take 4 tablets (40 mg total) by mouth daily with breakfast for 2 days, THEN 3 tablets (30 mg total) daily with breakfast for 2 days, THEN 2 tablets (20 mg total) daily with breakfast for 2 days, THEN 1 tablet (10 mg total) daily with breakfast for 1 day. 06/14/24 06/21/24 Yes Veta Palma, PA-C  Armodafinil 50 MG tablet Take 50 mg by mouth every morning. Patient not taking: Reported on 06/10/2024 02/10/23   [provider]  carvedilol (COREG) 6.25 MG tablet Take 6.25 mg by mouth 2 (two) times daily. 12/03/21   [provider]  cetirizine (ZYRTEC) 10 MG tablet Take 10 mg by mouth daily as needed for allergies.    [provider]  [employer] GENTLE LAXATIVE 5 MG EC tablet See admin instructions. Patient not taking: Reported on 06/10/2024 01/31/23   [provider]  cyclobenzaprine  (FLEXERIL ) 10 MG tablet Take 1 tablet (10 mg total) by mouth 2 (two) times daily as needed for muscle spasms. Patient not taking: Reported on 06/10/2024 10/18/23   Ruthe Cornet, DO  ferrous sulfate  325 (65 FE) MG EC tablet Take 1 tablet (325 mg total) by mouth 2 (two) times daily. Patient not taking:  Reported on 06/10/2024 02/13/23 03/15/23  Justus, Michael, MD  fluticasone (FLONASE) 50 MCG/ACT nasal spray Place 2 sprays into both nostrils as needed for allergies. 10/15/15   [provider]  Multiple Vitamin (MULTI VITAMIN) TABS 1 tablet Orally Once a day    [provider]  pantoprazole  (PROTONIX ) 40 MG tablet Take 40 mg by mouth daily. 02/07/23   [provider]  polyethylene glycol powder (MIRALAX) 17 GM/SCOOP powder 1 scoop mixed with 8 ounces of fluid Orally every other day    [provider]  SYMBICORT 80-4.5 MCG/ACT inhaler Inhale 2-3 puffs into the lungs as needed (wheezing/SOB). 04/29/20   [provider]  TRIBENZOR 40-10-25 MG TABS Take 1 tablet by mouth daily.    [provider]  WEGOVY  1 MG/0.5ML SOAJ SQ injection Inject 1 mg Subcutaneous once a week; Duration: 30 days    [provider]    Allergies: Patient has no known allergies.    Review of Systems  Musculoskeletal:        Left wrist pain    Updated Vital Signs BP (!) 162/95 (BP Location: Right Arm)   Pulse 90   Temp 98.8 F (37.1 C)   Resp 16   SpO2 100%   Physical Exam Vitals and nursing note reviewed.  Constitutional:      Appearance: Normal appearance.  HENT:     Head: Atraumatic.  Cardiovascular:  Comments: 2+ radial pulse in left upper extremity  Brisk cap refill in all digits of left hand Pulmonary:     Effort: Pulmonary effort is normal.  Musculoskeletal:     Comments: Left upper extremity:  General Edema and some mild erythema overlying the distal ulna.  No open wounds  Palpation Tender to palpation over the distal ulna.  Nontender over the proximal aspect of the ulna.  Nontender over the radius. Nontender of the carpal bones diffusely, no snuffbox TTP Nontender over the 1st through 5th metacarpals, 1st through 5th phalanges  ROM Full flexion extension at the wrist, but with pain Full flexion extension at the 1st through 5th  MCPs, PIPs, DIPs  Sensation: Sensation intact throughout the 1st-5th digits   Neurological:     General: No focal deficit present.     Mental Status: He is alert.  Psychiatric:        Mood and Affect: Mood normal.        Behavior: Behavior normal.     (all labs ordered are listed, but only abnormal results are displayed) Labs Reviewed - No data to display  EKG: None  Radiology: DG Wrist Complete Left Result Date: 06/14/2024 CLINICAL DATA:  Left wrist pain. EXAM: LEFT WRIST - COMPLETE 3+ VIEW COMPARISON:  None Available. FINDINGS: There is no evidence of fracture or dislocation. There is no evidence of arthropathy or other focal bone abnormality. A 2 mm opacity, possibly representing soft tissue calcification, is seen within the volar soft tissues of the left wrist. Mild soft tissue swelling is seen adjacent to the left ulnar styloid. IMPRESSION: Mild soft tissue swelling adjacent to the left ulnar styloid. Electronically Signed   By: Suzen Dials M.D.   On: 06/14/2024 19:18     Procedures   Medications Ordered in the ED  naproxen (NAPROSYN) tablet 500 mg (500 mg Oral Given 06/14/24 1958)                                    Medical Decision Making Amount and/or Complexity of Data Reviewed Radiology: ordered.    Differential diagnosis includes but is not limited to fracture, dislocation, sprain, strain, contusion, laceration, nerve injury, vascular injury, compartment syndrome, gout, septic arthritis  ED Course:  Upon initial evaluation, patient is well-appearing, no acute distress.  On exam, has tenderness over the distal ulna.  There is mild overlying edema and erythema.  No wounds.  He has full range of motion of the left wrist, left elbow, and the 1st through 5th MCPs, PIP, DIPs of the left hand.  He has 2+ pedal pulse in the left upper extremity and intact sensation.  X-ray was obtained which showed edema over the ulnar styloid, but no other acute abnormalities.   Given patient reports he was moving some boxes yesterday which seem to correspond with leg pain flared up, suspect there may be tendinitis contributing to his pain and swelling.  However, the erythema is not consistent with tendonitis and I also question if this could be a gout flare. Patient does report a history of gout.  No diffuse erythema or edema, no overlying wounds, low concern for septic arthritis at this time.  Will place patient to a wrist brace to help with pain and possible tendinitis.  Will also start him on a course of prednisone at home for potential gout.  He would like to start the prednisone tomorrow morning. Discussed  that he can also use naproxen as needed for pain.  Imaging Studies ordered: I ordered imaging studies including x-ray left wrist  I independently visualized the imaging with scope of interpretation limited to determining acute life threatening conditions related to emergency care. Imaging showed  IMPRESSION:  Mild soft tissue swelling adjacent to the left ulnar styloid.   I agree with the radiologist interpretation  Medications Given: Naproxen  Impression: Left wrist pain, possible tendinitis versus gout  Disposition:  Patient discharged home with instructions to take course of prednisone as prescribed.  Naproxen as needed for pain.  May wear wrist brace as needed for pain.  Follow-up with PCP if symptoms not improving within the next 3 days. Return precautions given and patient verbalized understanding.   This chart was dictated using voice recognition software, Dragon. Despite the best efforts of this provider to proofread and correct errors, errors may still occur which can change documentation meaning.       Final diagnoses:  Acute pain of left wrist    ED Discharge Orders          Ordered    predniSONE (DELTASONE) 10 MG tablet  Q breakfast        06/14/24 1958               Veta Palma, PA-C 06/14/24 2000    Simon Lavonia SAILOR,  MD 06/14/24 2043  "

## 2024-07-10 ENCOUNTER — Other Ambulatory Visit (HOSPITAL_BASED_OUTPATIENT_CLINIC_OR_DEPARTMENT_OTHER)

## 2024-07-11 ENCOUNTER — Ambulatory Visit: Admitting: Cardiovascular Disease
